# Patient Record
Sex: Female | Born: 1970 | Race: White | Hispanic: No | Marital: Married | Smoking: Current every day smoker
Health system: Southern US, Community
[De-identification: ages and names within clinical notes are randomized; demographics above are authoritative.]

## PROBLEM LIST (undated history)

## (undated) DIAGNOSIS — E119 Type 2 diabetes mellitus without complications: Secondary | ICD-10-CM

## (undated) DIAGNOSIS — G4730 Sleep apnea, unspecified: Secondary | ICD-10-CM

## (undated) DIAGNOSIS — J449 Chronic obstructive pulmonary disease, unspecified: Secondary | ICD-10-CM

## (undated) DIAGNOSIS — K219 Gastro-esophageal reflux disease without esophagitis: Secondary | ICD-10-CM

## (undated) DIAGNOSIS — G43909 Migraine, unspecified, not intractable, without status migrainosus: Secondary | ICD-10-CM

## (undated) DIAGNOSIS — M5030 Other cervical disc degeneration, unspecified cervical region: Secondary | ICD-10-CM

## (undated) DIAGNOSIS — J45909 Unspecified asthma, uncomplicated: Secondary | ICD-10-CM

## (undated) HISTORY — DX: Gastro-esophageal reflux disease without esophagitis: K21.9

## (undated) HISTORY — DX: Chronic obstructive pulmonary disease, unspecified: J44.9

## (undated) HISTORY — PX: SHOULDER ARTHROSCOPY: SHX128

## (undated) HISTORY — DX: Migraine, unspecified, not intractable, without status migrainosus: G43.909

## (undated) HISTORY — DX: Other cervical disc degeneration, unspecified cervical region: M50.30

## (undated) HISTORY — DX: Type 2 diabetes mellitus without complications: E11.9

## (undated) HISTORY — DX: Unspecified asthma, uncomplicated: J45.909

## (undated) HISTORY — PX: ANTERIOR FUSION CERVICAL SPINE: SUR626

## (undated) HISTORY — DX: Sleep apnea, unspecified: G47.30

## (undated) HISTORY — PX: CLAVICLE EXCISION: SUR503

---

## 2016-04-02 ENCOUNTER — Ambulatory Visit (HOSPITAL_COMMUNITY)
Admission: RE | Admit: 2016-04-02 | Discharge: 2016-04-02 | Disposition: A | Discharge status: Home / Self Care | Payer: Medicaid Other | Source: Ambulatory Visit | Attending: Obstetrics and Gynecology | Admitting: Obstetrics and Gynecology

## 2016-04-02 ENCOUNTER — Encounter (HOSPITAL_COMMUNITY): Payer: Self-pay | Admitting: *Deleted

## 2016-04-02 DIAGNOSIS — O09521 Supervision of elderly multigravida, first trimester: Secondary | ICD-10-CM | POA: Diagnosis not present

## 2016-04-02 DIAGNOSIS — Z3A13 13 weeks gestation of pregnancy: Secondary | ICD-10-CM | POA: Insufficient documentation

## 2016-04-02 DIAGNOSIS — O352XX0 Maternal care for (suspected) hereditary disease in fetus, not applicable or unspecified: Secondary | ICD-10-CM

## 2016-04-02 DIAGNOSIS — Z315 Encounter for genetic counseling: Secondary | ICD-10-CM | POA: Insufficient documentation

## 2016-04-04 ENCOUNTER — Other Ambulatory Visit (HOSPITAL_COMMUNITY): Payer: Self-pay

## 2016-04-04 DIAGNOSIS — Z3A13 13 weeks gestation of pregnancy: Secondary | ICD-10-CM | POA: Insufficient documentation

## 2016-04-04 DIAGNOSIS — O09529 Supervision of elderly multigravida, unspecified trimester: Secondary | ICD-10-CM | POA: Insufficient documentation

## 2016-04-04 DIAGNOSIS — O352XX0 Maternal care for (suspected) hereditary disease in fetus, not applicable or unspecified: Secondary | ICD-10-CM | POA: Insufficient documentation

## 2016-04-04 NOTE — Progress Notes (Signed)
Appointment Date: 04/02/2016 DOB: 12/26/1970 Referring Provider: Lennox Solders, DO Attending: Dr. Berna Spare  [name] and her partner, [name], were seen for genetic counseling because of a maternal age of 45 y.o. as well as family history concerns.  [name] oldest child, [name], was also present during our visit.     In summary:  Discussed AMA and associated risk for fetal aneuploidy  Discussed options for screening - NIPS drawn today  First screen  Quad screen  NIPS  Ultrasound  Discussed diagnostic testing options - declined  CVS  Amniocentesis  Reviewed family history concerns  Daughter has microdeletion 10q21.3 (maternally inherited)  Daughter has MECP2 and SLC2A1 variants (paternally inherited)  Son with Autism  Discussed carrier screening options - drawn today  CF  SMA  Hemoglobinopathies  They were counseled regarding maternal age and the association with risk for chromosome conditions due to nondisjunction with aging of the ova.   We reviewed chromosomes, nondisjunction, and the associated 1 in 10 risk for fetal aneuploidy related to a maternal age of 45 y.o. at [redacted]w[redacted]d gestation.  She was counseled that the risk for aneuploidy decreases as gestational age increases, accounting for those pregnancies which spontaneously abort.  We specifically discussed Down syndrome (trisomy 88), trisomies 33 and 64, and sex chromosome aneuploidies (47,XXX and 47,XXY) including the common features and prognoses of each.   We reviewed available screening options including First Screen, Quad screen, noninvasive prenatal screening (NIPS)/cell free DNA (cfDNA) screening, and detailed ultrasound.  They were counseled that screening tests are used to modify a patient's a priori risk for aneuploidy, typically based on age. This estimate provides a pregnancy specific risk assessment. We reviewed the benefits and limitations of each option. Specifically, we discussed  the conditions for which each test screens, the detection rates, and false positive rates of each. They were also counseled regarding diagnostic testing via CVS and amniocentesis. We reviewed the approximate 1 in 300-500 risk for complications from amniocentesis, including spontaneous pregnancy loss. We discussed the possible results that the tests might provide including: positive, negative, unanticipated, and no result. Finally, they were counseled regarding the cost of each option and potential out of pocket expenses.  After consideration of all the options, they elected to proceed with NIPS.  Those results will be available in 8-10 days.    [name] was provided with written information regarding cystic fibrosis (CF), spinal muscular atrophy (SMA) and hemoglobinopathies including the carrier frequency, availability of carrier screening and prenatal diagnosis if indicated.  In addition, we discussed that CF and hemoglobinopathies are routinely screened for as part of the [place] newborn screening panel.  After further discussion, she elected to pursue screening for CF, SMA and hemoglobinopathies.  Both family histories were reviewed.  [name] reported that her daughter, [name], was diagnosed with seizures at 48 months of age, she has intellectual disabilities, anxiety, speech delays and musculoskeletal pain.  [name] has been evaluated by medical genetics and undergone several different genetic testing panels.  [name] had an epilepsy panel performed which identified variants in 3 different genes known to be associated with autism and developmental delay.  One of those genes, CNTNAP2 is thought to function in a recessive way, therefore, the finding of only a single alteration is likely not clinically relevant. The other two genes, MECP2 and SLC2A1 are thought to function in an autosomal dominant way and both [name] and her ex-husband were tested for these variants.  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   Thus, while causality has not been established for either of these variants, given that the father of the baby is not [name], we would not be considered about these variants being found in her current pregnancy.  [name] also underwent microarray analysis through [facility].  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  The interpretation of [name]'s report indicated that because [name] was asymptomatic, it was unlikely that this deletion was related to [name]'s diagnoses.  However, that possibility could not be entirely ruled out.  In addition, there was the possibility that this deletion, in combination with other genetic and possibly non-genetic factors, could be contributing to [name]'s diagnoses. If those other "genetic factors" were paternal in origin, then the chance of recurrence for [name]'s current pregnancy would be small, as there is a different father of the baby.  [name] understands that because a specific cause for [name]'s diagnoses has not been established, we cannot ascribe a specific recurrence chance for [name]'s current pregnancy.  [name] reported that her second child, a son, has a diagnosis of autism.  She reported that he is high functioning and in the typical classroom.  In addition, he had unilateral preaxial polydactyly.  We discussed that autism is part of the spectrum of conditions referred to as Autistic spectrum disorders (ASD). We discussed that ASDs are among the most common neurodevelopmental disorders, with approximately 1 in 68 children meeting criteria for ASD, according to the Centers for Disease Control. Approximately 80% of individuals diagnosed are female. There is strong evidence that genetic factors play a critical role in development of ASD. There have been recent advances in identifying specific genetic causes of ASD, however, there are still many individuals for whom the etiology of the  ASD is not known. The majority of individuals with ASD (70-80%) have essential autism. Some individuals with ASDs are found to have causative differences on karyotype analysis, chromosomal microarray analysis, or single genes. These are more likely to be identified in individuals with complex autism spectrum disorders.  It is unclear if her son has had a workup similar to her daughter's.   Once a family has a child with a diagnosis of ASD, there is a 13.5% chance to have another child with ASD. If the pregnancy is female the chance is approximately 9%, and approximately 26% if the pregnancy is female. When there is more than one affected sibling, the recurrence chance is 32%. Therefore, based on what we know about this family and that there is a different father for her current pregnancy, an accurate recurrence chance for this pregnancy cannot be established. They understand that at this time there is not genetic testing available for ASD for most families. In the case of an identified genetic cause, recurrence risk estimate may change, and in some cases could be up to 50%. The patient is aware that for many individuals with autism spectrum disorders an underlying genetic cause is not identified. In the absence of an identified genetic etiology, prenatal screening or testing would not be available in the current pregnancy for the autism spectrum disorders in the family.   [name] has two maternal half siblings with intellectual disabilities, but does not have more information about those people.  The remainder of the family histories were reviewed and found to be noncontributory for birth defects, intellectual disability, and known genetic conditions. Without further information regarding the  provided family history, an accurate genetic risk cannot be calculated. Further genetic counseling is warranted if more information is obtained.  Ms. Wendy RobinsonsHogan denied exposure to environmental toxins or chemical agents. She  denied the use of alcohol, tobacco or street drugs. She denied significant viral illnesses during the course of her pregnancy. Her medical and surgical histories were noncontributory.   I counseled this couple regarding the above risks and available options.  The approximate face-to-face time with the genetic counselor was 95 minutes. Some of the counseling was provided by Vassie MomentKelly Kemak, [employer] genetic counseling student, under my direct supervision.   Mady Gemmaaragh Akaisha Truman, MS,  Certified Genetic Counselor

## 2016-04-09 ENCOUNTER — Telehealth (HOSPITAL_COMMUNITY): Payer: Self-pay | Admitting: Genetics

## 2016-04-09 NOTE — Telephone Encounter (Signed)
Called [name] to discuss her prenatal cell free DNA test results.  Mrs. [name] had Panorama testing through [facility].  Testing was offered because of AMA.   The patient was identified by name and DOB.  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  Mrs. [name] asked appropriate questions regarding the condition.  Despite discussing it prior to screening, and discussing the severity and life limiting nature of the condition on the phone, Mrs. [name] did not seem to grasp the concern related to this finding.  She asked that we contact her boyfriend, [name], to discuss these results with him. Despite multiple attempts, we were unable to reach him at this time.  Mrs. [name] was offered an appointment to meet in person to review these results and she requested to meet on Thursday, September 28th.  This was arranged for her at 1pm. She understands that the genetic counselor will be different from the one she initially met at her previous genetic counseling visit. She understands that the false positive rate is <0.1% for all conditions. Testing was also consistent with female fetal sex.  The patient did wish to know fetal sex.  She understands that this testing does not identify all genetic conditions.  All questions were answered to her satisfaction, she was encouraged to call with additional questions or concerns.  Cam Hai, MS Certified Genetic Counselor

## 2016-04-10 ENCOUNTER — Other Ambulatory Visit (HOSPITAL_COMMUNITY): Payer: Self-pay | Admitting: Maternal and Fetal Medicine

## 2016-04-10 ENCOUNTER — Inpatient Hospital Stay: Admission: RE | Admit: 2016-04-10 | Payer: Medicaid Other | Source: Ambulatory Visit

## 2016-04-10 ENCOUNTER — Ambulatory Visit (HOSPITAL_COMMUNITY)
Admission: RE | Admit: 2016-04-10 | Discharge: 2016-04-10 | Disposition: A | Discharge status: Home / Self Care | Payer: Medicaid Other | Source: Ambulatory Visit | Attending: Obstetrics and Gynecology | Admitting: Obstetrics and Gynecology

## 2016-04-10 ENCOUNTER — Ambulatory Visit (HOSPITAL_COMMUNITY)
Admission: RE | Admit: 2016-04-10 | Discharge: 2016-04-10 | Disposition: A | Discharge status: Home / Self Care | Payer: Medicaid Other | Source: Ambulatory Visit | Attending: Maternal and Fetal Medicine | Admitting: Maternal and Fetal Medicine

## 2016-04-10 DIAGNOSIS — O283 Abnormal ultrasonic finding on antenatal screening of mother: Secondary | ICD-10-CM | POA: Diagnosis not present

## 2016-04-10 DIAGNOSIS — O3512X0 Maternal care for (suspected) chromosomal abnormality in fetus, trisomy 18, not applicable or unspecified: Secondary | ICD-10-CM

## 2016-04-10 DIAGNOSIS — Z315 Encounter for genetic counseling: Secondary | ICD-10-CM | POA: Diagnosis not present

## 2016-04-10 DIAGNOSIS — O09521 Supervision of elderly multigravida, first trimester: Secondary | ICD-10-CM | POA: Insufficient documentation

## 2016-04-10 DIAGNOSIS — Z3A10 10 weeks gestation of pregnancy: Secondary | ICD-10-CM

## 2016-04-10 DIAGNOSIS — O3510X0 Maternal care for (suspected) chromosomal abnormality in fetus, unspecified, not applicable or unspecified: Secondary | ICD-10-CM

## 2016-04-10 DIAGNOSIS — O351XX0 Maternal care for (suspected) chromosomal abnormality in fetus, not applicable or unspecified: Secondary | ICD-10-CM

## 2016-04-14 ENCOUNTER — Encounter (HOSPITAL_COMMUNITY): Payer: Self-pay

## 2016-04-14 ENCOUNTER — Other Ambulatory Visit (HOSPITAL_COMMUNITY): Payer: Self-pay

## 2016-04-14 DIAGNOSIS — O3510X0 Maternal care for (suspected) chromosomal abnormality in fetus, unspecified, not applicable or unspecified: Secondary | ICD-10-CM | POA: Insufficient documentation

## 2016-04-14 DIAGNOSIS — O351XX0 Maternal care for (suspected) chromosomal abnormality in fetus, not applicable or unspecified: Secondary | ICD-10-CM | POA: Insufficient documentation

## 2016-04-14 NOTE — Progress Notes (Signed)
Genetic Counseling  High-Risk Gestation Note  Appointment Date:  04/10/16 Referred By: Wendy Solders, DO Date of Birth:  15-Mar-1971   Estimated Date of Delivery: 10/31/16 Estimated Gestational Age: [redacted]w[redacted]d Attending: Particia Nearing, MD   [name] and her partner were seen for follow-up genetic counseling because of a screen positive result for Trisomy 18 from NIPS (Panorama).   In summary:  Reviewed positive Trisomy 18 risk from NIPS (Panorama) and associated 93% positive predictive value  Discussed screening options of ultrasound  Diagnostic testing options reviewed of CVS and amniocentesis  Ultrasound performed today visualized fetal demise  We reviewed the results of noninvasive prenatal screening (NIPS)/Panorama, which were screen positive for Trisomy 18. NIPS was performed given advanced maternal age. We reviewed that the cell free DNA test can not distinguish between aneuploidy confined to the placenta, trisomy 18, partial trisomy 18, or mosaic trisomy 22. We reviewed the sensitivity, specificity, and false positive rate for this test. Given the patient's age and gestation, the positive predictive value of this result is estimated to be 93%.   Considering the very high suspicion of trisomy 5, they were counseled in detail regarding this diagnosis. We discussed that trisomy 46 represents the second most common autosomal trisomy after Down syndrome and occurs in 1 in 3600-8500 livebirths. The prevalence is estimated to be much higher when pregnancy terminations, stillbirths, and miscarriages are included. [name] was counseled that trisomy 31 results from meiotic nondisjunction involving the 18th pair of chromosomes in 94% of cases. Partial trisomy 18 occurs from an unbalanced translocation and represents <2% of cases; and mosaic trisomy 18 occurs in approximately 4-5% of cases.   We discussed the options of confirmation of the NIPS results via chorionic villus  sampling (CVS) or amniocentesis, or postnatal chromosome analysis. They expressed possible interest in amniocentesis but declined CVS. We discussed the options of screening via ultrasound including nuchal translucency assessment in the first trimester and detailed ultrasound in the second trimester.   She was then counseled that trisomy 21 is characterized by prenatal onset of growth deficiency, neurodevelopmental delay that typically results in profound intellectual disability in survivors, and significant organ system anomalies. Although any organ system can be involved, we specifically discussed that cardiac defects occur in 90% of children with trisomy 18; anomalies of the brain are present in nearly all (agenesis of the corpus callosum, small cerebellum, microgyria, and ventriculomegaly) individuals with trisomy 18; myelomeningocele occurs in 5% and approximately 2/3 of children with trisomy 18 have genitourinary anomalies, most commonly a horseshoe kidney. Additionally, omphaloceles are seen in ~30% of fetuses with trisomy 18 and limb deficiency defects (short or absent radii) occur in 5-10%. We also discussed that detailed ultrasound at 18+ weeks may identify other more subtle differences (markers) including choroid plexus cysts, rocker bottom feet, clenched hands, and strawberry shaped cranium. Although these later findings do not cause major medical problems, they are well described findings in fetuses with trisomy 38. [name] was counseled that the above discussed findings provide a generalized overview; however, each child with trisomy 40 is unique and an individualized healthcare plan must be established based on the needs of that child.   They were counseled that the prognosis for trisomy 18 is very guarded. She understands that pregnancies with trisomy 25 have a significantly increased risk for miscarriage and stillbirth. Of those that survive to term, ~90% of infants die during the first  year of life. Although this condition is often labeled as  lethal, [name] was counseled that ~5-8% of children do indeed survive, some well beyond a year of life. We discussed that neonates with trisomy 618 most often pass away from respiratory concerns secondary to congenital heart disease.   Although the amniocentesis results are pending, we discussed the option of continuing the pregnancy versus termination of pregnancy. [name] reported that she is not interested in the option of termination even if the diagnosis of trisomy 1618 is confirmed. She was counseled regarding the 20 week limit for this procedure in the state of [place]. We then discussed the pregnancy management recommendations and options. We discussed the option of a detailed anatomy ultrasound at 18+ weeks gestation to determine specific organ system involvement. Although this does not clearly predict prognosis, she understands that identifying which organs are involved can help her healthcare team provide appropriate anticipatory guidance.   The couple elected to proceed with nuchal translucency ultrasound today. Ultrasound was performed today following the couple's genetic counseling visit. Fetal demise was noted on today's ultrasound. Complete report under separate cover. This information was communicated to [name] by Dr. Sherrie Georgeecker at the time of today's visit. The patient planned to follow-up with her primary OB.   I counseled this couple for approximately 35 minutes regarding the above risks and available options.   Quinn PlowmanKaren Porche Steinberger, MS,  Certified Genetic Counselor 04/14/2016

## 2016-04-21 ENCOUNTER — Other Ambulatory Visit (HOSPITAL_COMMUNITY): Payer: Self-pay

## 2017-02-12 ENCOUNTER — Ambulatory Visit: Payer: Medicare HMO | Admitting: Allergy and Immunology

## 2017-07-19 IMAGING — US US MFM OB COMPLETE +14 WKS
1 series · 14 of 28 positions shown · non-contrast
Comparison: none

[Series 1: us mfm ob complete +14 wks · 14 of 63 slices shown]
[im 3/63]
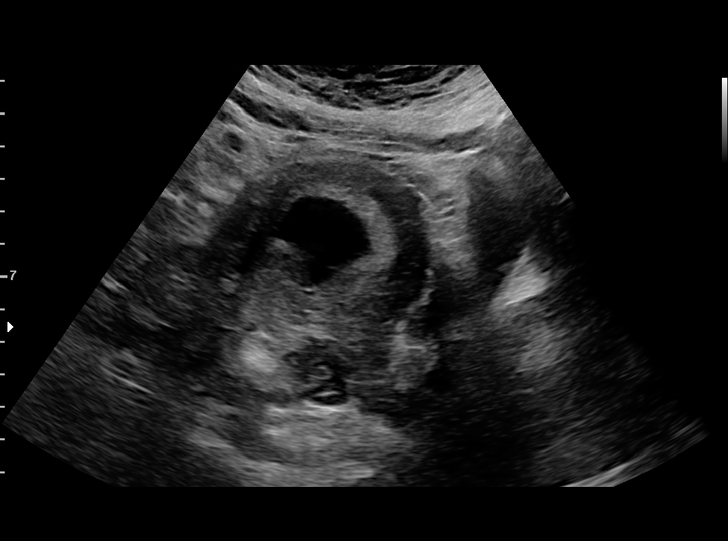
[im 7/63]
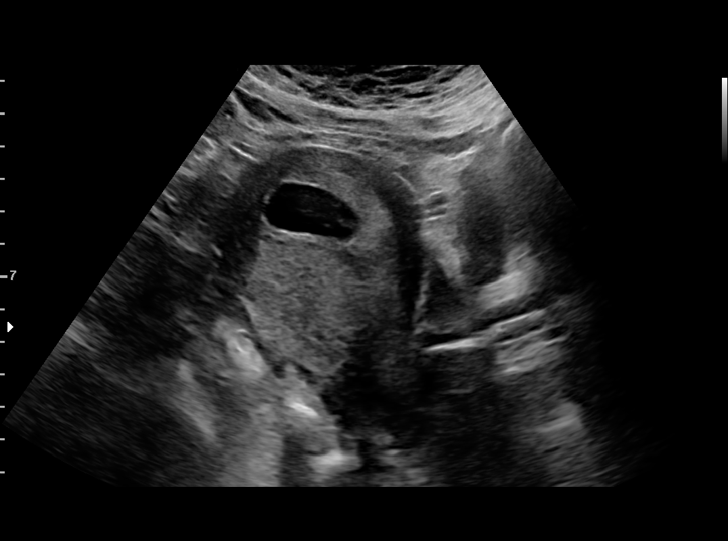
[im 12/63]
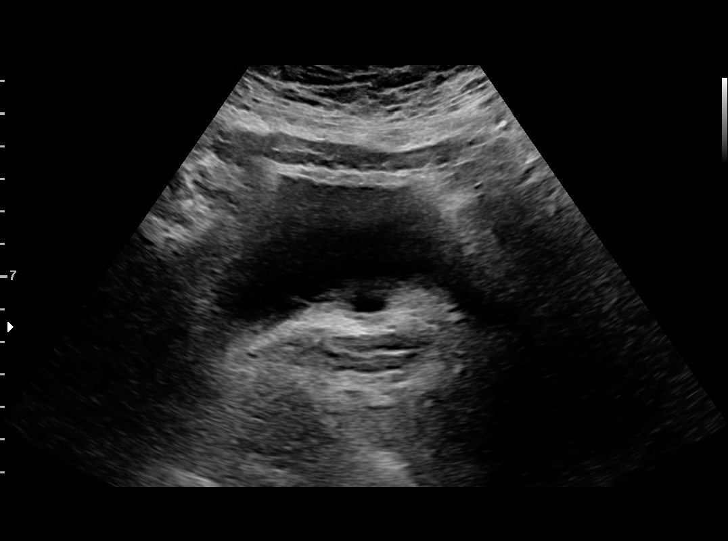
[im 17/63]
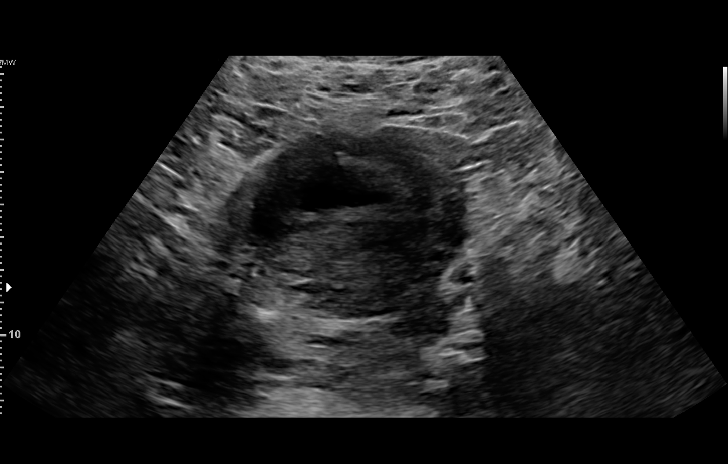
[im 21/63]
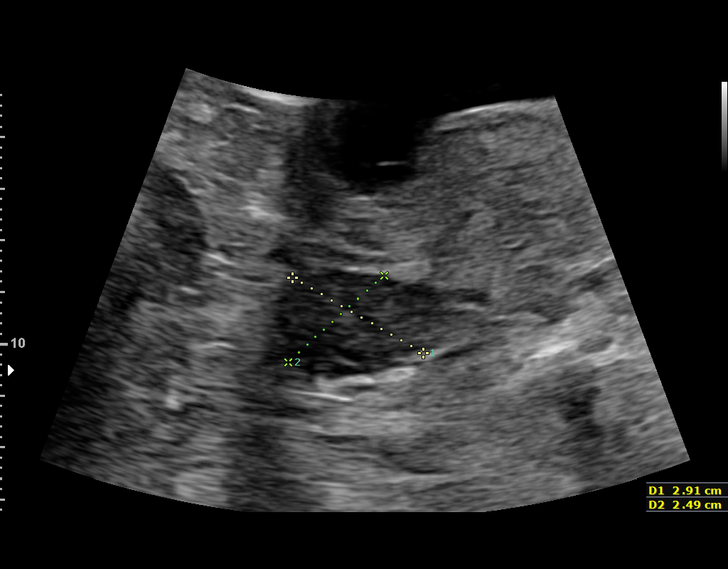
[im 26/63]
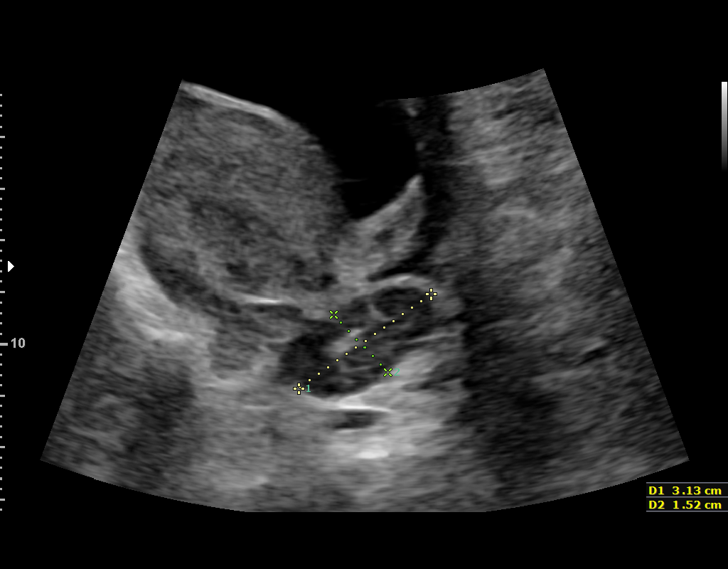
[im 30/63]
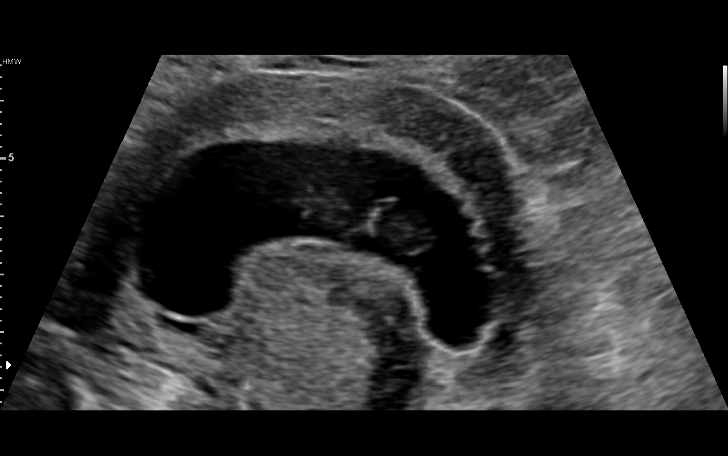
[im 35/63]
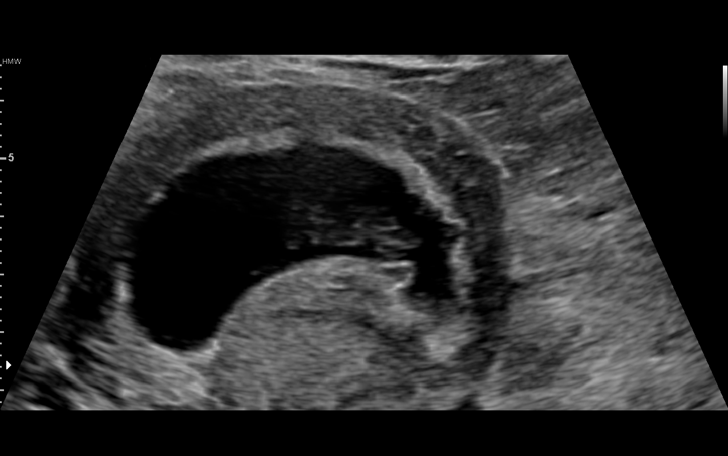
[im 40/63]
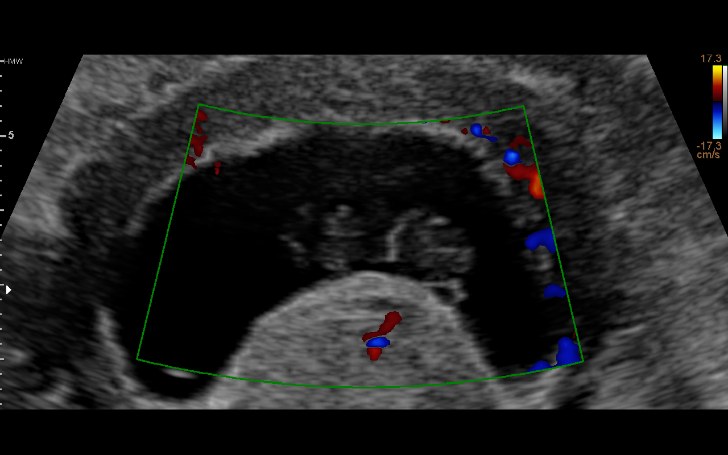
[im 44/63]
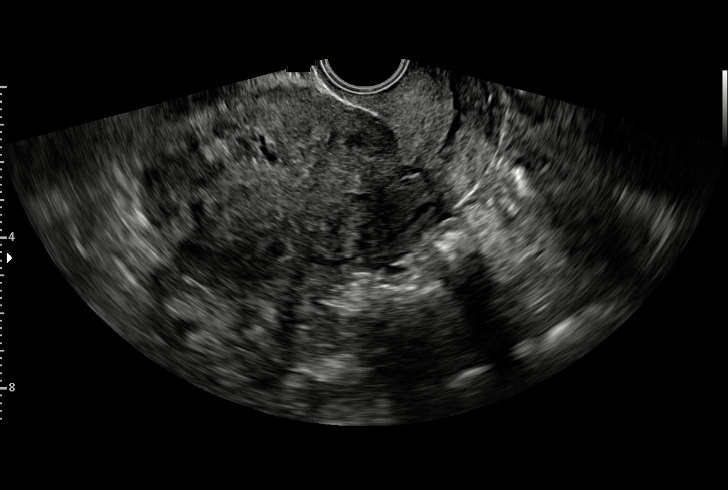
[im 49/63]
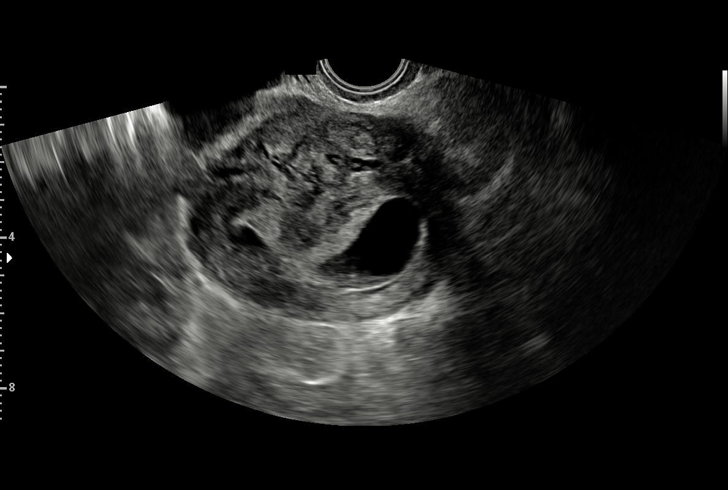
[im 53/63]
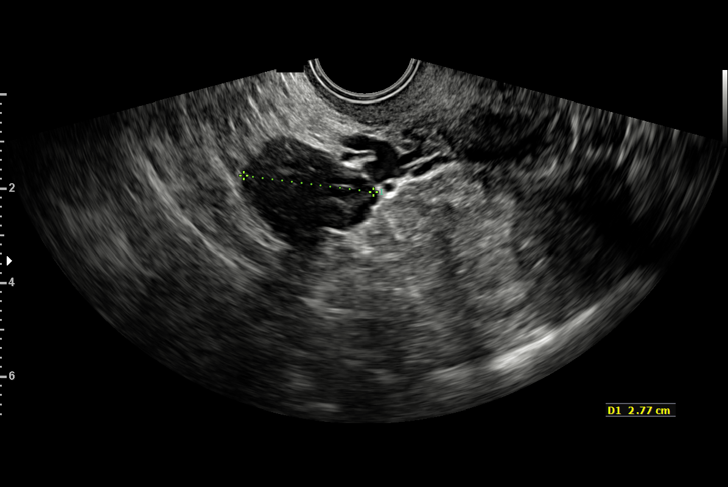
[im 58/63]
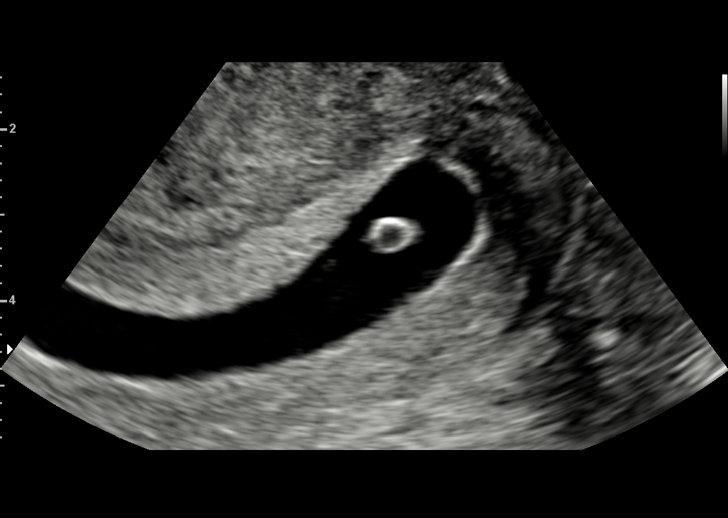
[im 63/63]
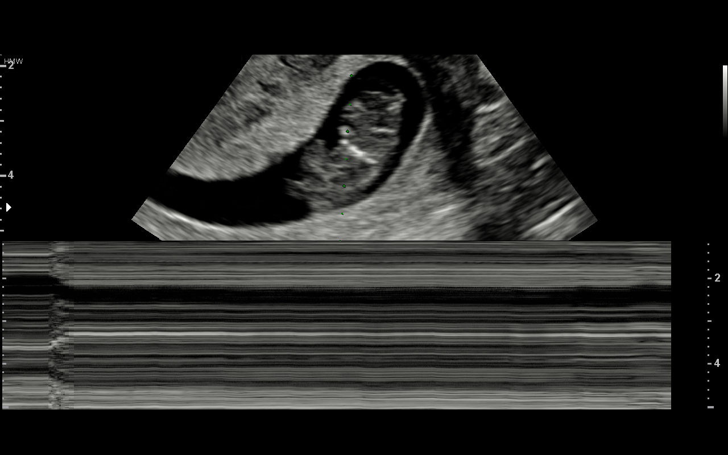

[14 of 28 positions shown; findings below may reference images not displayed]

[REDACTED],
[address]
[HOSPITAL], [place] DO

WEEKS

1  INGUNN HARPA RONLOR            [id number]      [id number]     [id number]
2  INGUNN HARPA RONLOR            [id number]      [id number]     [id number]
Indications

10 weeks gestation of pregnancy
Abnormal biochemical screen: high risk
NIPS for Trisomy 18
Advanced maternal age multigravida 36+,
first trimester
OB History

Gravidity:    4         Term:   3
Living:       3
Fetal Evaluation

Num Of Fetuses:     1
Preg. Location:     Intrauterine
Gest. Sac:          Intrauterine
Yolk Sac:           Visualized
Fetal Pole:         Visualized
Cardiac Activity:   Absent
Biometry

CRL:        26  mm     G. Age:  9w 2d                    EDD:   11/11/16
Gestational Age

Clinical EDD:  10w 6d                                        EDD:    10/31/16
Best:          10w 6d     Det. By:  Clinical EDD             EDD:    10/31/16
Cervix Uterus Adnexa

Cervix
Normal appearance by transabdominal scan.

Uterus
No abnormality visualized.

Left Ovary
Within normal limits.

Right Ovary
Within normal limits.

Cul De Sac:   No free fluid seen.

Adnexa:       No abnormality visualized.
Impression

Fetal demise at 10+6 weeks
High risk NIPS for T18
Embryo measured 9+2 weeks with ? omphalocele

The US findings were shared with Ms. Dal and her
husband.
Recommendations

Follow-up ultrasounds as clinically indicated.

## 2019-04-26 DIAGNOSIS — D72829 Elevated white blood cell count, unspecified: Secondary | ICD-10-CM

## 2023-10-26 ENCOUNTER — Other Ambulatory Visit: Payer: Self-pay | Admitting: Hematology and Oncology

## 2023-10-26 DIAGNOSIS — D72829 Elevated white blood cell count, unspecified: Secondary | ICD-10-CM
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
## 2023-10-27 ENCOUNTER — Inpatient Hospital Stay

## 2023-10-27 ENCOUNTER — Telehealth: Payer: Self-pay | Admitting: Hematology and Oncology

## 2023-10-27 ENCOUNTER — Other Ambulatory Visit: Payer: Self-pay

## 2023-10-27 ENCOUNTER — Inpatient Hospital Stay: Attending: Hematology and Oncology | Admitting: Hematology and Oncology

## 2023-10-27 ENCOUNTER — Encounter: Payer: Self-pay | Admitting: Hematology and Oncology

## 2023-10-27 VITALS — BP 107/78 | HR 93 | Temp 98.5°F | Resp 18 | Ht 63.9 in | Wt 157.2 lb

## 2023-10-27 DIAGNOSIS — Z803 Family history of malignant neoplasm of breast: Secondary | ICD-10-CM | POA: Diagnosis not present

## 2023-10-27 DIAGNOSIS — F17210 Nicotine dependence, cigarettes, uncomplicated: Secondary | ICD-10-CM | POA: Diagnosis not present

## 2023-10-27 DIAGNOSIS — D751 Secondary polycythemia: Secondary | ICD-10-CM | POA: Diagnosis present

## 2023-10-27 DIAGNOSIS — D72829 Elevated white blood cell count, unspecified: Secondary | ICD-10-CM

## 2023-10-27 DIAGNOSIS — Z800 Family history of malignant neoplasm of digestive organs: Secondary | ICD-10-CM | POA: Insufficient documentation

## 2023-10-27 LAB — CBC WITH DIFFERENTIAL (CANCER CENTER ONLY)
Abs Immature Granulocytes: 0.07 10*3/uL (ref 0.00–0.07)
Basophils Absolute: 0.1 10*3/uL (ref 0.0–0.1)
Basophils Relative: 1 %
Eosinophils Absolute: 0.2 10*3/uL (ref 0.0–0.5)
Eosinophils Relative: 2 %
HCT: 45.7 % (ref 36.0–46.0)
Hemoglobin: 15.4 g/dL — ABNORMAL HIGH (ref 12.0–15.0)
Immature Granulocytes: 1 %
Lymphocytes Relative: 35 %
Lymphs Abs: 5.1 10*3/uL — ABNORMAL HIGH (ref 0.7–4.0)
MCH: 29.4 pg (ref 26.0–34.0)
MCHC: 33.7 g/dL (ref 30.0–36.0)
MCV: 87.2 fL (ref 80.0–100.0)
Monocytes Absolute: 0.8 10*3/uL (ref 0.1–1.0)
Monocytes Relative: 5 %
Neutro Abs: 8.5 10*3/uL — ABNORMAL HIGH (ref 1.7–7.7)
Neutrophils Relative %: 56 %
Platelet Count: 355 10*3/uL (ref 150–400)
RBC: 5.24 MIL/uL — ABNORMAL HIGH (ref 3.87–5.11)
RDW: 12.9 % (ref 11.5–15.5)
WBC Count: 14.9 10*3/uL — ABNORMAL HIGH (ref 4.0–10.5)
nRBC: 0 % (ref 0.0–0.2)
nRBC: 0 /100{WBCs}

## 2023-10-27 LAB — TECHNOLOGIST SMEAR REVIEW

## 2023-10-27 NOTE — Telephone Encounter (Signed)
 Patient has been scheduled for follow-up visit per 10/27/23 LOS.  Pt given an appt calendar with date and time.

## 2023-10-28 LAB — SURGICAL PATHOLOGY

## 2023-10-29 LAB — FLOW CYTOMETRY

## 2023-10-31 LAB — BCR-ABL1 FISH
Cells Analyzed: 200
Cells Counted: 200

## 2023-11-10 ENCOUNTER — Inpatient Hospital Stay: Admitting: Hematology and Oncology

## 2023-11-10 ENCOUNTER — Inpatient Hospital Stay

## 2023-11-10 ENCOUNTER — Encounter: Payer: Self-pay | Admitting: Hematology and Oncology

## 2023-11-10 VITALS — BP 106/77 | HR 94 | Temp 98.7°F | Resp 20 | Ht 63.9 in | Wt 157.9 lb

## 2023-11-10 DIAGNOSIS — D751 Secondary polycythemia: Secondary | ICD-10-CM | POA: Diagnosis not present

## 2023-11-10 DIAGNOSIS — D72829 Elevated white blood cell count, unspecified: Secondary | ICD-10-CM

## 2023-11-10 NOTE — Progress Notes (Cosign Needed)
 [facility]  [address] [place],  [place]  [address] [phone]  Clinic Day:  11/10/2023  Referring physician: Spero Dye, PA-C   HISTORY OF PRESENT ILLNESS:  The patient is a 53 y.o. female with leukocytosis felt to be most likely due to cigarette smoking and chronic lung disease. She had a positive ANA 1:320 in 2024 and was seen by rheumatology. Further evaluation was otherwise negative.  At her consultation visit, she had cough for several weeks.  She denied unintentional weight loss or hemoptysis.  She has never had low-dose CT chest for lung cancer screening.  Due to her family history of malignancy, I recommend referral to genetic counseling for consideration of testing for hereditary cancer syndromes.   She is here today to review the results of the evaluation of her leukocytosis.Testing for CML and CLL was negative.  She states her cough is improving.  She denies unintentional weight loss or hemoptysis.  She continues to report generalized joint pain, as well as tingling and numbness of the hands and feet.  She has had an MRI of the brain, but we do not have the results.   PHYSICAL EXAM:  Blood pressure 106/77, pulse 94, temperature 98.7 F (37.1 C), temperature source Oral, resp. rate 20, height 5' 3.9" (1.623 m), weight 157 lb 14.4 oz (71.6 kg), SpO2 97%, unknown if currently breastfeeding. Wt Readings from Last 3 Encounters:  11/10/23 157 lb 14.4 oz (71.6 kg)  10/27/23 157 lb 3.2 oz (71.3 kg)   Body mass index is 27.19 kg/m.  Performance status (ECOG): 1 - Symptomatic but completely ambulatory  Physical Exam Vitals and nursing note reviewed.  Constitutional:      General: She is not in acute distress.    Appearance: Normal appearance.  HENT:     Head: Normocephalic and atraumatic.     Mouth/Throat:     Mouth: Mucous membranes are moist.     Pharynx: Oropharynx is clear. No oropharyngeal exudate or posterior oropharyngeal erythema.   Eyes:     General: No scleral icterus.    Extraocular Movements: Extraocular movements intact.     Conjunctiva/sclera: Conjunctivae normal.     Pupils: Pupils are equal, round, and reactive to light.  Cardiovascular:     Rate and Rhythm: Normal rate and regular rhythm.     Heart sounds: Normal heart sounds. No murmur heard.    No friction rub. No gallop.  Pulmonary:     Effort: Pulmonary effort is normal.     Breath sounds: Normal breath sounds. No wheezing, rhonchi or rales.  Abdominal:     General: There is no distension.     Palpations: Abdomen is soft. There is no hepatomegaly, splenomegaly or mass.     Tenderness: There is no abdominal tenderness.  Musculoskeletal:        General: Normal range of motion.     Cervical back: Normal range of motion and neck supple. No tenderness.     Right lower leg: No edema.     Left lower leg: No edema.  Lymphadenopathy:     Cervical: No cervical adenopathy.     Upper Body:     Right upper body: No supraclavicular or axillary adenopathy.     Left upper body: No supraclavicular or axillary adenopathy.     Lower Body: No right inguinal adenopathy. No left inguinal adenopathy.  Skin:    General: Skin is warm and dry.     Coloration: Skin is not  jaundiced.     Findings: No rash.  Neurological:     Mental Status: She is alert and oriented to person, place, and time.     Cranial Nerves: No cranial nerve deficit.  Psychiatric:        Mood and Affect: Mood normal.        Behavior: Behavior normal.        Thought Content: Thought content normal.     LABS:      Latest Ref Rng & Units 10/27/2023    2:20 PM  CBC  WBC 4.0 - 10.5 K/uL 14.9   Hemoglobin 12.0 - 15.0 g/dL 16.1   Hematocrit 09.6 - 46.0 % 45.7   Platelets 150 - 400 K/uL 355      Latest Reference Range & Units 10/27/23 14:20  Neutrophils % 56  Lymphocytes % 35  Monocytes Relative % 5  Eosinophil % 2  Basophil % 1  Immature Granulocytes % 1  NEUT# 1.7 - 7.7 K/uL 8.5 (H)   Lymphs Abs 0.7 - 4.0 K/uL 5.1 (H)  Monocyte # 0.1 - 1.0 K/uL 0.8  Eosinophils Absolute 0.0 - 0.5 K/uL 0.2  Basophils Absolute 0.0 - 0.1 K/uL 0.1  Abs Immature Granulocytes 0.00 - 0.07 K/uL 0.07  RBC Morphology  MORPHOLOGY UNREMARKABLE  WBC Morphology  MORPHOLOGY UNREMARKABLE  Plt Morphology  GIANT PLATELETS SEEN  (H): Data is abnormally high     STUDIES:  No results found.    ASSESSMENT & PLAN:   Assessment/Plan:  53 y.o. female with leukocytosis felt to be most likely due to cigarette smoking.  She understands the importance of complete abstinence from tobacco.  I will plan to see her back in 6 months for repeat clinical assessment.  The patient understands all the plans discussed today and is in agreement with them.  She knows to contact our office if she develops concerns prior to her next appointment.     Alfonso Ike, PA-C   Physician Assistant [facility] [place] [phone]

## 2023-11-12 ENCOUNTER — Encounter: Payer: Self-pay | Admitting: Hematology and Oncology

## 2023-11-16 LAB — CALR +MPL + E12-E15  (REFLEX)

## 2023-11-16 LAB — JAK2 V617F RFX CALR/MPL/E12-15

## 2023-11-17 ENCOUNTER — Telehealth: Payer: Self-pay

## 2023-11-17 NOTE — Telephone Encounter (Signed)
-----   Message from Alfonso Ike sent at 11/17/2023  7:54 AM EDT ----- Please let her know the additional leukemia testing is negative. Thanks

## 2023-11-17 NOTE — Telephone Encounter (Signed)
 Patient notified and voiced understanding.

## 2023-12-03 ENCOUNTER — Inpatient Hospital Stay

## 2023-12-03 ENCOUNTER — Inpatient Hospital Stay (HOSPITAL_BASED_OUTPATIENT_CLINIC_OR_DEPARTMENT_OTHER): Attending: Hematology and Oncology | Admitting: Genetic Counselor

## 2023-12-03 ENCOUNTER — Encounter: Payer: Self-pay | Admitting: Genetic Counselor

## 2023-12-03 DIAGNOSIS — Z1379 Encounter for other screening for genetic and chromosomal anomalies: Secondary | ICD-10-CM

## 2023-12-03 DIAGNOSIS — Z808 Family history of malignant neoplasm of other organs or systems: Secondary | ICD-10-CM

## 2023-12-03 DIAGNOSIS — Z803 Family history of malignant neoplasm of breast: Secondary | ICD-10-CM | POA: Diagnosis not present

## 2023-12-03 DIAGNOSIS — Z800 Family history of malignant neoplasm of digestive organs: Secondary | ICD-10-CM

## 2023-12-03 LAB — GENETIC SCREENING ORDER

## 2023-12-03 NOTE — Progress Notes (Signed)
 REFERRING PROVIDER: Alfonso Ike, PA-C [address] [place],  [place] [address]  PRIMARY PROVIDER:  Nodal, Ulysees Gander, PA-C  PRIMARY REASON FOR VISIT:  Encounter Diagnoses  Name Primary?   Family history of breast cancer Yes   Family history of melanoma    Family history of cancer of extrahepatic bile ducts     HISTORY OF PRESENT ILLNESS:   Ms. Babic, a 53 y.o. female, was seen for a  cancer genetics consultation at the request of Vera Gip, PA-C due to a family history of breast and other cancers.  Ms. Delatorre presents to clinic today to discuss the possibility of a hereditary predisposition to cancer, to discuss genetic testing, and to further clarify her future cancer risks, as well as potential cancer risks for family members.   Ms. Grieshop is a 53 y.o. female with no personal history of cancer.    CANCER HISTORY:  Oncology History   No history exists.    SCREENING/RISK FACTORS:  Mammogram within the last year: no Number of breast biopsies: 0. Colonoscopy: yes; most recent in February; 1 polyp per patient. Hysterectomy: no.  Ovaries intact: yes.  Menarche was at age 24.  First live birth at age 60.  Menopausal status: postmenopausal. LMP at 52 OCP use for approximately 15 years.  HRT use: 0 years. Dermatology screening: previously.   Blood transfusion within past 4 weeks: no   Past Medical History:  Diagnosis Date   Asthma    COPD (chronic obstructive pulmonary disease) (HCC)    DDD (degenerative disc disease), cervical    Diabetes (HCC)    GERD (gastroesophageal reflux disease)    Migraines    Sleep apnea     Past Surgical History:  Procedure Laterality Date   ANTERIOR FUSION CERVICAL SPINE     CLAVICLE EXCISION Right    SHOULDER ARTHROSCOPY Right      FAMILY HISTORY:  We obtained a detailed, 4-generation family history.  Significant diagnoses are listed below: Family History  Problem Relation Age of Onset   Breast cancer Mother  45       d. 10   Cancer Father 9       biliary   Breast cancer Sister        dx 59s; mets; d. 76s; mat half sister   Breast cancer Maternal Aunt        dx >50   Melanoma Maternal Uncle        back/neck; dx >50; no systemic therapy   Breast cancer Maternal Grandmother        dx 10s   Cancer Maternal Grandfather        unknown type; "tumor on neck"; dx >50   Thyroid cancer Cousin        mat female cousin     Ms. Corliss is unaware of previous family history of genetic testing for hereditary cancer risks. She reported possible Jewish ancestry in paternal relatives. There is no known consanguinity.  GENETIC COUNSELING ASSESSMENT: Ms. Chalmers is a 53 y.o. female with a family history which is somewhat suggestive of a hereditary cancer syndrome and predisposition to cancer given the number of maternal relatives who have had breast cancer. We, therefore, discussed and recommended the following at today's visit.   DISCUSSION: We discussed that 5 - 10% of cancer is hereditary, with most cases of hereditary breast cancer associated with mutations in BRCA1/2.  There are other genes that can be associated with hereditary breast cancer syndromes.  We discussed that testing is beneficial for several reasons, including knowing about other cancer risks, identifying potential screening and risk-reduction options that may be appropriate, and understanding if other family members could be at an increased risk for cancer and allowing them to undergo genetic testing.  We reviewed the characteristics, features and inheritance patterns of hereditary cancer syndromes. We also discussed genetic testing, including the appropriate family members to test, the process of testing, insurance coverage and turn-around-time for results. We discussed the implications of a negative, positive, and variant of uncertain significant result. We discussed that negative results would be uninformative given that Ms. Ripoll does not  have a personal history of cancer. We recommended Ms. Poulter pursue genetic testing for a panel that contains genes associated with breast, melanoma, thyroid, biliary tract, and other cancers.  The CancerNext-Expanded gene panel offered by [facility] and includes sequencing, rearrangement, and RNA analysis for the following 77 genes: AIP, ALK, APC, ATM, AXIN2, BAP1, BARD1, BMPR1A, BRCA1, BRCA2, BRIP1, CDC73, CDH1, CDK4, CDKN1B, CDKN2A, CEBPA, CHEK2, CTNNA1, DDX41, DICER1, ETV6, FH, FLCN, GATA2, LZTR1, MAX, MBD4, MEN1, MET, MLH1, MSH2, MSH3, MSH6, MUTYH, NF1, NF2, NTHL1, PALB2, PHOX2B, PMS2, POT1, PRKAR1A, PTCH1, PTEN, RAD51C, RAD51D, RB1, RSP20, RET, RUNX1, SDHA, SDHAF2, SDHB, SDHC, SDHD, SMAD4, SMARCA4, SMARCB1, SMARCE1, STK11, SUFU, TMEM127, TP53, TSC1, TSC2, VHL, and WT1 (sequencing and deletion/duplication); EGFR, HOXB13, KIT, MITF, PDGFRA, POLD1, and POLE (sequencing only); EPCAM and GREM1 (deletion/duplication only).   Based on Ms. Gaumond's family history of breast cancer in four maternal close relatives, she meets NCCN medical criteria for genetic testing. She is the most informative relative available for genetic testing.  We discussed that if her out of pocket cost for testing is over $100, the laboratory should contact them to discuss self-pay options and/or patient pay assistance programs.   We discussed the Genetic Information Non-Discrimination Act (GINA) of 2008, which helps protect individuals against genetic discrimination based on their genetic test results.  It impacts both health insurance and employment.  With health insurance, it protects against genetic test results being used for increased premiums or policy termination. For employment, it protects against hiring, firing and promoting decisions based on genetic test results.  GINA does not apply to those in the [employer], those who work for companies with less than 15 employees, and new life insurance or long-term disability insurance  policies.  Health status due to a cancer diagnosis is not protected under GINA.  PLAN: After considering the risks, benefits, and limitations, Ms. Carolynne Citron provided informed consent to pursue genetic testing and the blood sample was sent to [facility] for analysis of the CancerNext-Expanded +RNAinsight Panel. Results should be available within approximately 3 weeks' time, at which point they will be disclosed by telephone to Ms. Gallego, as will any additional recommendations warranted by these results. Ms. Dupriest will receive a summary of her genetic counseling visit and a copy of her results once available. This information will also be available in Epic.   Based on Ms. Junker's family history, we recommended first degree relatives of those who have had breast cancer have genetic counseling and testing. Ms. Jorstad will let us  know if we can be of any assistance in coordinating genetic counseling and/or testing for appropriate family members.   Ms. Gutkowski questions were answered to her satisfaction today. Our contact information was provided should additional questions or concerns arise. Thank you for the referral and allowing us  to share in the care of your patient.   Urbano Milhouse M.  Ora Billing, MS, [facility] Genetic Counselor [email or web address] (P) [phone]    40 minutes were spent on the date of the encounter in service to the patient including preparation, face-to-face consultation, documentation and care coordination.  The patient was accompanied by her husband.  Drs. Iruku, Gudena and/or Maryalice Smaller were available to discuss this case as needed.   _______________________________________________________________________ For Office Staff:  Number of people involved in session: 2 Was an Intern/ student involved with case: no

## 2023-12-21 ENCOUNTER — Encounter: Payer: Self-pay | Admitting: Genetic Counselor

## 2023-12-21 ENCOUNTER — Ambulatory Visit: Payer: Self-pay | Admitting: Genetic Counselor

## 2023-12-21 DIAGNOSIS — Z9189 Other specified personal risk factors, not elsewhere classified: Secondary | ICD-10-CM

## 2023-12-21 DIAGNOSIS — Z803 Family history of malignant neoplasm of breast: Secondary | ICD-10-CM

## 2023-12-21 DIAGNOSIS — Z1379 Encounter for other screening for genetic and chromosomal anomalies: Secondary | ICD-10-CM | POA: Insufficient documentation

## 2023-12-21 DIAGNOSIS — Z800 Family history of malignant neoplasm of digestive organs: Secondary | ICD-10-CM

## 2023-12-21 DIAGNOSIS — Z808 Family history of malignant neoplasm of other organs or systems: Secondary | ICD-10-CM

## 2023-12-21 HISTORY — DX: Other specified personal risk factors, not elsewhere classified: Z91.89

## 2023-12-21 NOTE — Progress Notes (Signed)
 HPI:   Ms. Sloma was previously seen in the [facility] due to a family history of breast and other cancers and concerns regarding a hereditary predisposition to cancer.    Ms. Holderman recent genetic test results were disclosed to her by telephone. These results and recommendations are discussed in more detail below.  CANCER HISTORY:  Oncology History   No history exists.    FAMILY HISTORY:  We obtained a detailed, 4-generation family history.  Significant diagnoses are listed below: Family History  Problem Relation Age of Onset   Breast cancer Mother 77        d. 72   Cancer Father 66        biliary   Breast cancer Sister          dx 69s; mets; d. 46s; mat half sister   Breast cancer Maternal Aunt          dx >50   Melanoma Maternal Uncle          back/neck; dx >50; no systemic therapy   Breast cancer Maternal Grandmother          dx 61s   Cancer Maternal Grandfather          unknown type; "tumor on neck"; dx >50   Thyroid cancer Cousin          mat female cousin       Ms. Staib is unaware of previous family history of genetic testing for hereditary cancer risks. She reported possible Jewish ancestry in paternal relatives. There is no known consanguinity.  GENETIC TEST RESULTS:  The [facility] CancerNext-Expanded +RNAinsight Panel found no pathogenic mutations.   The CancerNext-Expanded gene panel offered by [facility] and includes sequencing, rearrangement, and RNA analysis for the following 77 genes: AIP, ALK, APC, ATM, AXIN2, BAP1, BARD1, BMPR1A, BRCA1, BRCA2, BRIP1, CDC73, CDH1, CDK4, CDKN1B, CDKN2A, CEBPA, CHEK2, CTNNA1, DDX41, DICER1, ETV6, FH, FLCN, GATA2, LZTR1, MAX, MBD4, MEN1, MET, MLH1, MSH2, MSH3, MSH6, MUTYH, NF1, NF2, NTHL1, PALB2, PHOX2B, PMS2, POT1, PRKAR1A, PTCH1, PTEN, RAD51C, RAD51D, RB1, RET, RUNX1, RSP20, SDHA, SDHAF2, SDHB, SDHC, SDHD, SMAD4, SMARCA4, SMARCB1, SMARCE1, STK11, SUFU, TMEM127, TP53, TSC1, TSC2, VHL, and WT1  (sequencing and deletion/duplication); EGFR, HOXB13, KIT, MITF, PDGFRA, POLD1, and POLE (sequencing only); EPCAM and GREM1 (deletion/duplication only).   The test report has been scanned into EPIC and is located under the Molecular Pathology section of the Results Review tab.  A portion of the result report is included below for reference. Genetic testing reported out on 12/17/2023.       Even though a pathogenic variant was not identified, possible explanations for the cancer in the family may include: There may be no hereditary risk for cancer in the family. The cancers in Ms. Godbey's family may be sporadic/familial or due to other genetic and environmental factors.  Most cancer is not hereditary.  There may be a gene mutation in one of these genes that current testing methods cannot detect but that chance is small. There could be another gene that has not yet been discovered, or that we have not yet tested, that is responsible for the cancer diagnoses in the family.  It is also possible there is a hereditary cause for the cancer in the family that Ms. Vohra did not inherit.   Therefore, it is important to remain in touch with cancer genetics in the future so that we can continue to offer Ms. Gambrill the most up to date genetic testing.  ADDITIONAL GENETIC TESTING:   Ms. Sachs genetic testing was fairly extensive.  If there are additional relevant genes identified to increase cancer risk that can be analyzed in the future, we would be happy to discuss and coordinate this testing at that time.     CANCER SCREENING RECOMMENDATIONS:  Ms. Bartnick test result is considered negative (normal).  This means that we have not identified a hereditary cause for her family history of cancer at this time.   An individual's cancer risk and medical management are not determined by genetic test results alone. Overall cancer risk assessment incorporates additional factors, including personal medical  history, family history, and any available genetic information that may result in a personalized plan for cancer prevention and surveillance. Therefore, it is recommended she continue to follow the cancer management and screening guidelines provided by her primary healthcare provider.  Ms. Eichler has been determined to be at high risk for breast cancer.  Her lifetime risk for breast cancer based on Tyrer-Cuzick risk model is 23.3%.  This risk estimate can change over time and may be repeated to reflect new information in her personal or family history in the future.  For females with a greater than 20% lifetime risk of breast cancer, the [employer] (NCCN) recommends the following:  Clinical encounter every 6-12 months to begin when identified as being at increased risk, but not before age 34  Annual mammograms. Tomosynthesis is recommended starting 10 years earlier than the youngest breast cancer diagnosis in the family or at age 48 (whichever comes first), but not before age 47   Annual breast MRI starting 10 years earlier than the youngest breast cancer diagnosis in the family or at age 5 (whichever comes first), but not before age 92   In addition to a yearly mammogram and physical exam by a healthcare provider, she should discuss the usefulness of an annual breast MRI with her primary care provider.  We discussed that she should also discuss a breast screening plan with her referring provider, Vera Gip, PA.   RECOMMENDATIONS FOR FAMILY MEMBERS:   Since she did not inherit a identifiable mutation in a cancer predisposition gene included on this panel, her children could not have inherited a known mutation from her in one of these genes. Individuals in this family might be at some increased risk of developing cancer, over the general population risk, due to the family history of cancer.  Individuals in the family should notify their providers of the family history of  cancer. We recommend women in this family have a yearly mammogram beginning at age 14, or 37 years younger than the earliest onset of cancer, an annual clinical breast exam, and perform monthly breast self-exams.  Risk models that take into account family history and hormonal history may be helpful in determining appropriate breast cancer screening options for family members.  Family members should have colonoscopies by at age 61, or earlier, as recommended by their providers. Female relatives should speak with their providers about prostate cancer screening.  Other members of the family may still carry a pathogenic variant in one of these genes that Ms. Kerwood did not inherit. Based on the family history, we recommend first degree relatives of those in her maternal family who had breast cancer (now deceased) have genetic counseling and testing. Ms. Coggin can let us  know if we can be of any assistance in coordinating genetic counseling and/or testing for these family members.     FOLLOW-UP:  Cancer genetics is a rapidly advancing field and it is possible that new genetic tests will be appropriate for her and/or her family members in the future. We encourage Ms. Dinh to remain in contact with cancer genetics, so we can update her personal and family histories and let her know of advances in cancer genetics that may benefit this family.   Our contact number was provided.  They are welcome to call us  at anytime with additional questions or concerns.   Omere Marti M. Ora Billing, MS, [facility] Genetic Counselor [email or web address] (P) [phone]

## 2024-02-21 ENCOUNTER — Encounter (HOSPITAL_BASED_OUTPATIENT_CLINIC_OR_DEPARTMENT_OTHER): Payer: Self-pay | Admitting: Emergency Medicine

## 2024-02-21 ENCOUNTER — Ambulatory Visit (HOSPITAL_BASED_OUTPATIENT_CLINIC_OR_DEPARTMENT_OTHER): Admission: EM | Admit: 2024-02-21 | Discharge: 2024-02-21 | Disposition: A | Discharge status: Home / Self Care

## 2024-02-21 DIAGNOSIS — R6884 Jaw pain: Secondary | ICD-10-CM

## 2024-02-21 DIAGNOSIS — B3731 Acute candidiasis of vulva and vagina: Secondary | ICD-10-CM | POA: Diagnosis not present

## 2024-02-21 DIAGNOSIS — K029 Dental caries, unspecified: Secondary | ICD-10-CM | POA: Diagnosis not present

## 2024-02-21 MED ORDER — NYSTATIN 100000 UNIT/GM EX CREA: TOPICAL_CREAM | CUTANEOUS | 0 refills | Status: AC

## 2024-02-21 MED ORDER — FLUCONAZOLE 150 MG PO TABS
ORAL_TABLET | ORAL | 0 refills | Status: DC
Start: 2024-02-21 — End: 2024-05-11

## 2024-02-21 NOTE — Discharge Instructions (Signed)
 Vaginal yeast infection: Educated about causes of yeast infection and treatment options.  Advised about the side effects of use of Jardiance and the need for significant hygiene efforts to reduce risk of yeast infections due to glucosuria from [place].  Apply thin amounts of nystatin  cream vaginally up to 4 times daily if needed for itch or irritation.  Fluconazole  150 mg 1 pill now and repeat every 5 to 7 days up to a total of 4 pills over 3 weeks.  Jaw pain secondary to enlarged lymph nodes and dental caries: Patient has only lower front teeth but she has significant dental caries.  She has some lymphadenopathy in her neck and jaw.  She has Cipro from her primary care for possible UTI and I advised her to complete the Cipro as it might help some of this oral infection.  Follow-up with primary care as needed.  Follow-up with dentist about the dental caries.  Follow-up here if needed.

## 2024-02-21 NOTE — ED Triage Notes (Signed)
 Pt thinks she has a yeast infection and UTI. Pt feels bad she started jardiance for her blood sugars on Friday. Her doctor was scared it might cause a UTI and so she was prescribed Cipro in case She only took one dose of the jardiance because she thinks she was having side effects to medication. Pt reports she has a white thick discharge no itching she had a pap smear recently and it showed yeast on it.

## 2024-02-21 NOTE — ED Provider Notes (Signed)
 [name]    CSN: [id number] Arrival date & time: 02/21/24  1156      History   Chief Complaint Chief Complaint  Patient presents with   Urinary Frequency   Vaginitis    HPI [name] is a 53 y.o. female.   53 year old female who has diabetes and is newly on Jardiance.  She has had chronic yeast infections secondary to diabetes for some time.  Her provider talked to her about using Jardiance and provided both Cipro and a warning about possible yeast infections due to [place] use.  She was told Bernadine might cause a UTI also and to use the Cipro if needed.  She has taken 1 dose of Jardiance but then stopped because she feels like it is already giving her side effects.  She had a Pap smear within the last few weeks and it was normal but did show some yeast infection.  She has a thick white discharge vaginally and quite a bit of vaginal irritation and itch.  She thinks she has an acute yeast infection.  She did take Cipro 500 mg earlier this morning in case she had a UTI.  She is having urinary frequency and then with the vaginal symptoms but no fever.   Urinary Frequency Pertinent negatives include no chest pain, no abdominal pain and no shortness of breath.    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, antepartum 04/14/2016   [redacted] weeks gestation of pregnancy    Advanced maternal age in multigravida    Hereditary disease in family possibly affecting fetus, affecting management of mother,  antepartum condition or complication     Past Surgical History:  Procedure Laterality Date   ANTERIOR FUSION CERVICAL SPINE     CLAVICLE EXCISION Right    SHOULDER ARTHROSCOPY Right     OB History     Gravida  1   Para      Term      Preterm      AB      Living         SAB      IAB      Ectopic      Multiple      Live Births               Home Medications    Prior to Admission medications   Medication Sig Start Date End Date Taking? Authorizing Provider  Continuous Glucose Sensor (FREESTYLE LIBRE 3 PLUS SENSOR) MISC USE AS DIRECTED EVERY 15 DAYS TO MONITOR BLOOD SUGAR CONTINUOSLY 02/08/24  Yes [provider]  fluconazole  (DIFLUCAN ) 150 MG tablet By mouth today and repeat every 5 days for vaginal yeast infection.  May use up to four pills, if needed 02/21/24  Yes Ival Domino, FNP  Insulin Aspart FlexPen (NOVOLOG) 100 UNIT/ML Inject 5 units three times daily with meals plus 1 unit for every 50 above 150 mg/dL. Use up to 50 units per day.  12/30/23  Yes [provider]  JARDIANCE 25 MG TABS tablet Take 25 mg by mouth daily. 02/15/24  Yes [provider]  metFORMIN (GLUCOPHAGE-XR) 500 MG 24 hr tablet Take by mouth. 12/30/23 03/29/24 Yes [provider]  nystatin  cream (MYCOSTATIN ) Apply an thin coat to the vaginal area up to 4 times daily if needed for vaginal itch or irritation 02/21/24  Yes Ival Domino, FNP  rosuvastatin (CRESTOR) 20 MG tablet Take 20 mg by mouth at bedtime. 01/14/24 01/13/25 Yes [provider]  TRESIBA FLEXTOUCH 100 UNIT/ML FlexTouch Pen SMARTSIG:20 Unit(s) SUB-Q Every Morning 02/05/24  Yes [provider]  acetaminophen (TYLENOL) 500 MG tablet Take 1,000 mg by mouth every 4 (four) hours as needed. 05/17/18   [provider]  albuterol (VENTOLIN HFA) 108 (90 Base) MCG/ACT inhaler Inhale 2 puffs into the lungs every 4 (four) hours as needed. 10/07/22 09/23/24  [provider]  aspirin  EC 81 MG tablet Take 81 mg by mouth.    [provider]  azelastine (ASTELIN) 0.1 % nasal spray Place 1 spray into both nostrils 2 (two) times daily.    [provider]  budeson-glycopyrrolate-formoterol (BREZTRI AEROSPHERE) 160-9-4.8 MCG/ACT AERO inhaler Inhale 2 puffs into the lungs 2 (two) times daily. Patient not taking: Reported on 10/27/2023 01/06/23   [provider]  busPIRone (BUSPAR) 5 MG tablet Take 5 mg by mouth 3 (three) times daily. 02/23/23   [provider]  Continuous Glucose Receiver (DEXCOM G7 RECEIVER) DEVI Use as directed for continuous glucose monitoring. 06/24/22   [provider]  cyclobenzaprine (FLEXERIL) 5 MG tablet Take 5 mg by mouth 3 (three) times daily as needed. Patient not taking: Reported on 10/27/2023    [provider]  EPINEPHrine 0.3 mg/0.3 mL IJ SOAJ injection 0.3 mg as needed. 02/06/21   [provider]  gentamicin (GARAMYCIN) 0.3 % ophthalmic solution Place 1 drop into the right eye 3 (three) times daily. 10/27/23   [provider]  ibuprofen (ADVIL) 200 MG tablet Take 200 mg by mouth 3 (three) times daily. 05/07/11   [provider]  insulin aspart (NOVOLOG) 100 UNIT/ML injection For use in insulin pump. Use at least 3 times a day. Use up to 100 units per day. 09/03/23 09/02/24  [provider]  Insulin Disposable Pump (OMNIPOD 5 DEXG7G6 PODS GEN 5) MISC Inject into the skin. 09/03/23   [provider]  Insulin Disposable Pump (OMNIPOD 5 DEXG7G6 PODS GEN 5) MISC SMARTSIG:SUB-Q Every 3 Days    [provider]  Insulin Glargine (BASAGLAR KWIKPEN) 100 UNIT/ML Inject 24 units when off insulin pump 09/12/22   [provider]  Insulin Pen Needle (BD PEN NEEDLE NANO U/F) 32G X 4 MM MISC Use one needle with insulin once per day 07/19/21   [provider]  Lancets (ONETOUCH DELICA PLUS LANCET33G) MISC USE ONCE DAILY TO CHECK BLOOD SUGAR 06/13/20   [provider]  montelukast (SINGULAIR) 10 MG tablet Take 1 tablet by mouth at bedtime. 10/27/23 02/24/24  [provider]  omeprazole (PRILOSEC) 40 MG capsule Take 1 capsule by mouth daily. Patient not taking: Reported on 11/10/2023 10/15/22   [provider]  ondansetron (ZOFRAN) 4 MG tablet Take 4 mg by mouth every 6 (six) hours as needed. Patient not taking: Reported on 11/10/2023 10/15/21   [provider]  ONETOUCH ULTRA test strip FOR USE UP TO FOUR TIMES DAILY FOR FINGER STICK BLOOD GLUCOSE MONITORING    [provider]  traZODone (DESYREL) 50 MG tablet Take 1 tablet by mouth at bedtime. 01/27/23 01/27/24  [provider]    Family History Family History  Problem Relation Age of Onset   Diabetes Mother    Breast cancer Mother 26       d. 7   Heart disease Mother    Rheum arthritis Mother    Hypertension Father    Epilepsy Father    Biliary Cirrhosis Father    Cancer Father 33       biliary   Breast cancer Sister        dx 41s; mets; d. 44s; mat half sister   Breast cancer Maternal Aunt        dx >50   Melanoma Maternal Uncle        back/neck; dx >50; no systemic therapy   Rheum arthritis Maternal Grandmother    Heart disease Maternal Grandmother    Hypertension Maternal Grandmother    Diabetes Maternal Grandmother    Breast cancer Maternal Grandmother        dx 65s   Cancer Maternal Grandfather        unknown type; tumor on neck; dx >50   Thyroid cancer Cousin        mat female cousin    Social History Social History   Tobacco Use   Smoking status: Every Day    Types: Cigarettes   Smokeless tobacco: Never  Vaping Use   Vaping status: Never Used  Substance Use Topics   Alcohol use: Never   Drug use: Never     Allergies   Citalopram, Gabapentin, Iodine-131, Meloxicam, Sulfamethoxazole, Other, Penicillins, Phosphorated carbohyd-caff, Shellfish-derived products, Codeine, and Topiramate   Review of Systems Review of  Systems  Constitutional:  Negative for chills and fever.  HENT:  Negative for ear pain and sore throat.   Eyes:  Negative for pain and visual disturbance.  Respiratory:  Negative for cough and shortness of breath.   Cardiovascular:  Negative for chest pain and palpitations.  Gastrointestinal:  Negative for abdominal pain, constipation, diarrhea, nausea and vomiting.  Genitourinary:  Positive for frequency, vaginal discharge and vaginal pain. Negative for dysuria and hematuria.  Musculoskeletal:  Negative for arthralgias and back pain.  Skin:  Negative for color change and rash.  Neurological:  Negative for seizures and syncope.  All other systems reviewed and are negative.    Physical Exam Triage Vital Signs ED Triage Vitals  Encounter Vitals Group     BP 02/21/24 1215 115/81     Girls Systolic BP Percentile --      Girls Diastolic BP Percentile --      Boys Systolic BP Percentile --      Boys Diastolic BP Percentile --      Pulse Rate 02/21/24 1215 89     Resp 02/21/24 1215 18     Temp 02/21/24 1215 98.1 F (36.7 C)     Temp Source 02/21/24 1215 Oral     SpO2 02/21/24 1215 95 %     Weight --      Height --      Head Circumference --      Peak Flow --      Pain Score 02/21/24 1211 0     Pain Loc --      Pain Education --      Exclude from Growth Chart --    No data found.  Updated Vital Signs BP 115/81 (BP Location: Right Arm)  Pulse 89   Temp 98.1 F (36.7 C) (Oral)   Resp 18   SpO2 95%   Visual Acuity Right Eye Distance:   Left Eye Distance:   Bilateral Distance:    Right Eye Near:   Left Eye Near:    Bilateral Near:     Physical Exam Vitals and nursing note reviewed.  Constitutional:      General: She is not in acute distress.    Appearance: She is well-developed. She is not ill-appearing or toxic-appearing.  HENT:     Head: Normocephalic and atraumatic.     Right Ear: Hearing, tympanic membrane, ear canal and external ear normal.     Left Ear:  Hearing, tympanic membrane, ear canal and external ear normal.     Nose: No congestion or rhinorrhea.     Right Sinus: No maxillary sinus tenderness or frontal sinus tenderness.     Left Sinus: No maxillary sinus tenderness or frontal sinus tenderness.     Mouth/Throat:     Lips: Pink.     Mouth: Mucous membranes are moist.     Dentition: Abnormal dentition. Dental tenderness, gingival swelling and dental caries present.     Pharynx: Uvula midline. No oropharyngeal exudate or posterior oropharyngeal erythema.     Tonsils: No tonsillar exudate.  Eyes:     Conjunctiva/sclera: Conjunctivae normal.     Pupils: Pupils are equal, round, and reactive to light.  Cardiovascular:     Rate and Rhythm: Normal rate and regular rhythm.     Heart sounds: S1 normal and S2 normal. No murmur heard. Pulmonary:     Effort: Pulmonary effort is normal. No respiratory distress.     Breath sounds: Normal breath sounds. No decreased breath sounds, wheezing, rhonchi or rales.  Abdominal:     General: Bowel sounds are normal.     Palpations: Abdomen is soft.     Tenderness: There is abdominal tenderness in the right lower quadrant, suprapubic area and left lower quadrant. There is no right CVA tenderness, left CVA tenderness, guarding or rebound. Negative signs include Murphy's sign, Rovsing's sign and McBurney's sign.  Genitourinary:    Comments: Exam declined today.  She will return if symptoms persist. Musculoskeletal:        General: No swelling.     Cervical back: Neck supple.  Lymphadenopathy:     Head:     Right side of head: No submental, submandibular, tonsillar, preauricular or posterior auricular adenopathy.     Left side of head: No submental, submandibular, tonsillar, preauricular or posterior auricular adenopathy.     Cervical: No cervical adenopathy.     Right cervical: No superficial cervical adenopathy.    Left cervical: No superficial cervical adenopathy.  Skin:    General: Skin is warm  and dry.     Capillary Refill: Capillary refill takes less than 2 seconds.     Findings: No rash.  Neurological:     Mental Status: She is alert and oriented to person, place, and time.  Psychiatric:        Mood and Affect: Mood normal.      UC Treatments / Results  Labs (all labs ordered are listed, but only abnormal results are displayed) Labs Reviewed - No data to display  EKG   Radiology No results found.  Procedures Procedures (including critical care time)  Medications Ordered in UC Medications - No data to display  Initial Impression / Assessment and Plan / UC Course  I have reviewed  the triage vital signs and the nursing notes.  Pertinent labs & imaging results that were available during my care of the patient were reviewed by me and considered in my medical decision making (see chart for details).  Plan of Care: Vaginal yeast infection: Based on patient's reported symptoms, will treat for yeast infection.  Fluconazole  150 mg now and repeat every 5 to 7 days, up to a total of 4 pills over 3 weeks.  Use nystatin  cream, and thin amounts vaginally, up to 4 times daily if needed for vaginal itch or irritation.  Educated on use of Jardiance and the need for very good hygiene to reduce the risk of yeast infection from the glucosuria.  Will return for recheck if symptoms persist and we will get a pelvic exam at that time.  Jaw pain secondary to enlarged lymph nodes and dental caries: Patient has Cipro from primary care for possible UTI symptoms.  She started it this morning.  Urinalysis not done due to her taking 500 mg Cipro 4 hours before the visit.  Encouraged to complete the Cipro because that should help the irritation or infection associated with her dental caries and the enlarged lymph node should improve.  Follow-up if symptoms do not improve, worsen or new symptoms occur.  I reviewed the plan of care with the patient and/or the patient's guardian.  The patient and/or  guardian had time to ask questions and acknowledged that the questions were answered.  I provided instruction on symptoms or reasons to return here or to go to an ER, if symptoms/condition did not improve, worsened or if new symptoms occurred.  Final Clinical Impressions(s) / UC Diagnoses   Final diagnoses:  Vaginal candidiasis  Dental caries  Jaw pain, non-TMJ     Discharge Instructions      Vaginal yeast infection: Educated about causes of yeast infection and treatment options.  Advised about the side effects of use of Jardiance and the need for significant hygiene efforts to reduce risk of yeast infections due to glucosuria from [place].  Apply thin amounts of nystatin  cream vaginally up to 4 times daily if needed for itch or irritation.  Fluconazole  150 mg 1 pill now and repeat every 5 to 7 days up to a total of 4 pills over 3 weeks.  Jaw pain secondary to enlarged lymph nodes and dental caries: Patient has only lower front teeth but she has significant dental caries.  She has some lymphadenopathy in her neck and jaw.  She has Cipro from her primary care for possible UTI and I advised her to complete the Cipro as it might help some of this oral infection.  Follow-up with primary care as needed.  Follow-up with dentist about the dental caries.  Follow-up here if needed.     ED Prescriptions     Medication Sig Dispense Auth. Provider   fluconazole  (DIFLUCAN ) 150 MG tablet By mouth today and repeat every 5 days for vaginal yeast infection.  May use up to four pills, if needed 4 tablet Ival Domino, FNP   nystatin  cream (MYCOSTATIN ) Apply an thin coat to the vaginal area up to 4 times daily if needed for vaginal itch or irritation 30 g Ival Domino, FNP      PDMP not reviewed this encounter.   Ival Domino, FNP 02/21/24 1348

## 2024-05-09 ENCOUNTER — Other Ambulatory Visit: Payer: Self-pay | Admitting: Hematology and Oncology

## 2024-05-09 DIAGNOSIS — D72829 Elevated white blood cell count, unspecified: Secondary | ICD-10-CM

## 2024-05-09 NOTE — Progress Notes (Unsigned)
 [facility]  [address] [place],  [place]  [address] [phone]  Clinic Day:  05/11/2024  Referring physician: Hunter Mickey Browner, PA-C   HISTORY OF PRESENT ILLNESS:  The patient is a 53 y.o. female with leukocytosis felt to be most likely due to cigarette smoking and chronic lung disease. Testing for CML and CLL was negative.  She had a positive ANA 1:320 in 2024 and was seen by rheumatology. Further evaluation was otherwise negative.  At her consultation visit, she had cough for several weeks.  She denied unintentional weight loss or hemoptysis.  She has not yet had low-dose CT chest for lung cancer screening.  Due to her family history of malignancy, I recommend referral to genetic counseling for consideration of testing for hereditary cancer syndromes. Genetic testing with the [facility] did not reveal any clinically significant mutation or variant of uncertain significance. Her lifetime risk for breast cancer based on Tyrer-Cuzick risk model is 23.3%.  MRI brain in April revealed no evidence of acute intracranial abnormality.  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   She is here today for repeat clinical assessment.  She continues to smoke 1 pack of cigarettes daily.  Since her last visit, she has not tried quitting.  In the past she has used gum and lozenges, but these caused her heartburn.  She has not tried the patches due to cost.  She reports significant anxiety, which Chantix or Zyban could potentially worsen.  She reports a decreased appetite, but she is not losing weight.  She denies hemoptysis.  She asked me to look at a skin lesion behind her right ear.  She states she is scheduled for bilateral mammogram and MRI breast on November 5 at [facility].  She has not had a low-dose CT chest for lung cancer screening yet.  She states her blood sugars have been better.  She reports low back pain today, for which she saw Dr. Gust  earlier this month. 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 He discussed getting MRI lumbar spine before considering injections, but she opted for physical therapy instead.  Dr. Gust gave her a steroid injection in September for neck and left arm pain with resolution of pain. MRI cervical spine in October, which revealed previous fusion at C5-C7 with progression of degenerative disc and joint disease at these levels, particularly C5-C6.  PHYSICAL EXAM:  Blood pressure 112/75, pulse 79, temperature 98.8 F (37.1 C), temperature source Oral, resp. rate 20, height 5' 3.9 (1.623 m), weight 152 lb 12.8 oz (69.3 kg), SpO2 96%, unknown if currently breastfeeding. Wt Readings from Last 3 Encounters:  05/11/24 152 lb 12.8 oz (69.3 kg)  11/10/23 157 lb 14.4 oz (71.6 kg)  10/27/23 157 lb 3.2 oz (71.3 kg)   Body mass index is 26.31 kg/m.  Performance status (ECOG): 1 - Symptomatic but completely ambulatory  Physical Exam Vitals and nursing note reviewed.  Constitutional:      General: She is not in acute distress.    Appearance: She is normal weight.  HENT:     Head: Normocephalic and atraumatic.     Mouth/Throat:     Mouth: Mucous membranes are moist.     Pharynx: Oropharynx is clear. No oropharyngeal exudate or posterior oropharyngeal erythema.  Eyes:     General: No scleral icterus.    Extraocular Movements: Extraocular movements intact.     Conjunctiva/sclera: Conjunctivae normal.  Pupils: Pupils are equal, round, and reactive to light.  Cardiovascular:     Rate and Rhythm: Normal rate and regular rhythm.     Heart sounds: Normal heart sounds. No murmur heard.    No friction rub. No gallop.  Pulmonary:     Effort: Pulmonary effort is normal.     Breath sounds: Normal breath sounds. No wheezing, rhonchi or rales.  Chest:  Breasts:    Right: Normal. No swelling, bleeding, inverted nipple, mass, nipple discharge, skin change or tenderness.      Left: Normal. No swelling, bleeding, inverted nipple, mass, nipple discharge, skin change or tenderness.  Abdominal:     General: There is no distension.     Palpations: Abdomen is soft. There is no hepatomegaly, splenomegaly or mass.     Tenderness: There is no abdominal tenderness.  Musculoskeletal:        General: Normal range of motion.     Cervical back: Normal range of motion and neck supple. No tenderness.     Right lower leg: No edema.     Left lower leg: No edema.  Lymphadenopathy:     Cervical: No cervical adenopathy.     Upper Body:     Right upper body: No supraclavicular or axillary adenopathy.     Left upper body: No supraclavicular or axillary adenopathy.     Lower Body: No right inguinal adenopathy. No left inguinal adenopathy.  Skin:    General: Skin is warm and dry.     Coloration: Skin is not jaundiced.     Findings: Lesion (Flat, oblong white brown lesion behind the right ear) present. No rash.  Neurological:     Mental Status: She is alert and oriented to person, place, and time.     Cranial Nerves: No cranial nerve deficit.  Psychiatric:        Mood and Affect: Mood normal.        Behavior: Behavior normal.        Thought Content: Thought content normal.     LABS:      Latest Ref Rng & Units 05/11/2024    1:00 PM 10/27/2023    2:20 PM  CBC  WBC 4.0 - 10.5 K/uL 14.6  14.9   Hemoglobin 12.0 - 15.0 g/dL 83.1  84.5   Hematocrit 36.0 - 46.0 % 50.7  45.7   Platelets 150 - 400 K/uL 312  355     Latest Reference Range & Units 05/11/24 13:00  Neutrophils % 60  Lymphocytes % 34  Monocytes Relative % 4  Eosinophil % 1  Basophil % 1  Immature Granulocytes % 0  NEUT# 1.7 - 7.7 K/uL 8.7 (H)  Lymphs Abs 0.7 - 4.0 K/uL 4.9 (H)  Monocyte # 0.1 - 1.0 K/uL 0.6  Eosinophils Absolute 0.0 - 0.5 K/uL 0.2  Basophils Absolute 0.0 - 0.1 K/uL 0.1  Abs Immature Granulocytes 0.00 - 0.07 K/uL 0.06  RBC Morphology  MORPHOLOGY UNREMARKABLE  WBC Morphology  MORPHOLOGY  UNREMARKABLE  Plt Morphology  Normal platelet morphology  (H): Data is abnormally high      STUDIES:  No results found.    ASSESSMENT & PLAN:   Assessment/Plan:  53 y.o. female with   Leukocytosis and erythrocytosis felt to be due to cigarette smoking.  Testing for myeloproliferative neoplasms was negative.  There is nothing concerning per her peripheral smear today.  Unfortunately, she continues to smoke 1 pack daily.    Tobacco abuse.  She has not had  a low-dose CT chest for lung cancer screening, so I will schedule that today.  We discussed the benefits versus risks and shared decision making and she agrees.  Discussed using nicotine patches to help with cravings.  I gave her information regarding the Panacea quit line.  She may be eligible for free patches through that program.  She understands the importance of complete abstinence from tobacco.  Family history of breast cancer.  Genetic testing was negative.  She has calculated lifetime risk of breast cancer of over 23%.   For females with a greater than 20% lifetime risk of breast cancer, the [employer] (NCCN) recommends the following:  A. Clinical encounter every 6-12 months to begin when identified as being at increased risk.  Bilateral breast exam is negative  B. Annual mammograms. Tomosynthesis is recommended starting 10 years earlier than the youngest breast cancer diagnosis in the family or at age 3.  She is scheduled for bilateral mammogram next week  C. Annual breast MRI starting 10 years earlier than the youngest breast cancer diagnosis in the family or at age 33.  She is scheduled for MRI breast next week  4.   Light brown skin lesion behind right ear, which appears benign.  Offered to send her to dermatology for skin cancer screening, but she declines.  She would like to monitor this area.    I will plan to see her back in 6 months for repeat clinical assessment.  The patient understands all the  plans discussed today and is in agreement with them.  She knows to contact our office if she develops concerns prior to her next appointment.     Andrez DELENA Foy, PA-C   Physician Assistant [facility] [phone]

## 2024-05-11 ENCOUNTER — Encounter: Payer: Self-pay | Admitting: Hematology and Oncology

## 2024-05-11 ENCOUNTER — Inpatient Hospital Stay

## 2024-05-11 ENCOUNTER — Inpatient Hospital Stay: Attending: Hematology and Oncology | Admitting: Hematology and Oncology

## 2024-05-11 ENCOUNTER — Other Ambulatory Visit: Payer: Self-pay

## 2024-05-11 VITALS — BP 112/75 | HR 79 | Temp 98.8°F | Resp 20 | Ht 63.9 in | Wt 152.8 lb

## 2024-05-11 DIAGNOSIS — D72829 Elevated white blood cell count, unspecified: Secondary | ICD-10-CM

## 2024-05-11 DIAGNOSIS — F17210 Nicotine dependence, cigarettes, uncomplicated: Secondary | ICD-10-CM | POA: Diagnosis not present

## 2024-05-11 DIAGNOSIS — Z803 Family history of malignant neoplasm of breast: Secondary | ICD-10-CM | POA: Diagnosis not present

## 2024-05-11 DIAGNOSIS — D751 Secondary polycythemia: Secondary | ICD-10-CM | POA: Diagnosis present

## 2024-05-11 DIAGNOSIS — L989 Disorder of the skin and subcutaneous tissue, unspecified: Secondary | ICD-10-CM | POA: Insufficient documentation

## 2024-05-11 DIAGNOSIS — Z720 Tobacco use: Secondary | ICD-10-CM

## 2024-05-11 LAB — CBC WITH DIFFERENTIAL (CANCER CENTER ONLY)
Abs Immature Granulocytes: 0.06 K/uL (ref 0.00–0.07)
Basophils Absolute: 0.1 K/uL (ref 0.0–0.1)
Basophils Relative: 1 %
Eosinophils Absolute: 0.2 K/uL (ref 0.0–0.5)
Eosinophils Relative: 1 %
HCT: 50.7 % — ABNORMAL HIGH (ref 36.0–46.0)
Hemoglobin: 16.8 g/dL — ABNORMAL HIGH (ref 12.0–15.0)
Immature Granulocytes: 0 %
Lymphocytes Relative: 34 %
Lymphs Abs: 4.9 K/uL — ABNORMAL HIGH (ref 0.7–4.0)
MCH: 29.5 pg (ref 26.0–34.0)
MCHC: 33.1 g/dL (ref 30.0–36.0)
MCV: 88.9 fL (ref 80.0–100.0)
Monocytes Absolute: 0.6 K/uL (ref 0.1–1.0)
Monocytes Relative: 4 %
Neutro Abs: 8.7 K/uL — ABNORMAL HIGH (ref 1.7–7.7)
Neutrophils Relative %: 60 %
Platelet Count: 312 K/uL (ref 150–400)
RBC: 5.7 MIL/uL — ABNORMAL HIGH (ref 3.87–5.11)
RDW: 14.1 % (ref 11.5–15.5)
WBC Count: 14.6 K/uL — ABNORMAL HIGH (ref 4.0–10.5)
nRBC: 0 % (ref 0.0–0.2)

## 2024-05-11 LAB — TECHNOLOGIST SMEAR REVIEW: Plt Morphology: NORMAL

## 2024-05-24 ENCOUNTER — Ambulatory Visit (HOSPITAL_BASED_OUTPATIENT_CLINIC_OR_DEPARTMENT_OTHER): Admitting: Radiology

## 2024-05-27 ENCOUNTER — Ambulatory Visit (INDEPENDENT_AMBULATORY_CARE_PROVIDER_SITE_OTHER)
Admission: RE | Admit: 2024-05-27 | Discharge: 2024-05-27 | Disposition: A | Discharge status: Home / Self Care | Source: Ambulatory Visit | Attending: Hematology and Oncology | Admitting: Hematology and Oncology

## 2024-05-27 DIAGNOSIS — Z720 Tobacco use: Secondary | ICD-10-CM

## 2024-05-27 DIAGNOSIS — F17210 Nicotine dependence, cigarettes, uncomplicated: Secondary | ICD-10-CM | POA: Diagnosis not present

## 2024-06-07 ENCOUNTER — Telehealth: Payer: Self-pay

## 2024-06-07 NOTE — Telephone Encounter (Signed)
 Left detailed message about CT results

## 2024-06-07 NOTE — Telephone Encounter (Signed)
-----   Message from Andrez DELENA Foy sent at 06/07/2024 12:13 PM EST ----- Please let her know her lung cancer screening was negative. Thanks

## 2024-06-16 NOTE — Telephone Encounter (Signed)
 Pt aware.

## 2024-06-16 NOTE — Telephone Encounter (Signed)
 Dr.robbins to stop taking the metformin at this time but needs to talk to her DM doctor and then if sugar continues to be low to stop the jardiance.

## 2024-06-25 ENCOUNTER — Emergency Department (HOSPITAL_BASED_OUTPATIENT_CLINIC_OR_DEPARTMENT_OTHER)
Admission: EM | Admit: 2024-06-25 | Discharge: 2024-06-26 | Disposition: A | Discharge status: Home / Self Care | Attending: Emergency Medicine | Admitting: Emergency Medicine

## 2024-06-25 ENCOUNTER — Emergency Department (HOSPITAL_BASED_OUTPATIENT_CLINIC_OR_DEPARTMENT_OTHER)

## 2024-06-25 ENCOUNTER — Other Ambulatory Visit: Payer: Self-pay

## 2024-06-25 ENCOUNTER — Encounter (HOSPITAL_BASED_OUTPATIENT_CLINIC_OR_DEPARTMENT_OTHER): Payer: Self-pay | Admitting: Emergency Medicine

## 2024-06-25 DIAGNOSIS — R29898 Other symptoms and signs involving the musculoskeletal system: Secondary | ICD-10-CM

## 2024-06-25 DIAGNOSIS — J4489 Other specified chronic obstructive pulmonary disease: Secondary | ICD-10-CM | POA: Insufficient documentation

## 2024-06-25 DIAGNOSIS — R000 Tachycardia, unspecified: Secondary | ICD-10-CM | POA: Diagnosis not present

## 2024-06-25 DIAGNOSIS — Z794 Long term (current) use of insulin: Secondary | ICD-10-CM | POA: Insufficient documentation

## 2024-06-25 DIAGNOSIS — R531 Weakness: Secondary | ICD-10-CM | POA: Diagnosis not present

## 2024-06-25 DIAGNOSIS — R0602 Shortness of breath: Secondary | ICD-10-CM | POA: Diagnosis not present

## 2024-06-25 DIAGNOSIS — Z7951 Long term (current) use of inhaled steroids: Secondary | ICD-10-CM | POA: Insufficient documentation

## 2024-06-25 DIAGNOSIS — E119 Type 2 diabetes mellitus without complications: Secondary | ICD-10-CM | POA: Diagnosis not present

## 2024-06-25 DIAGNOSIS — R0981 Nasal congestion: Secondary | ICD-10-CM | POA: Diagnosis not present

## 2024-06-25 DIAGNOSIS — Z7984 Long term (current) use of oral hypoglycemic drugs: Secondary | ICD-10-CM | POA: Diagnosis not present

## 2024-06-25 DIAGNOSIS — Z7982 Long term (current) use of aspirin: Secondary | ICD-10-CM | POA: Insufficient documentation

## 2024-06-25 DIAGNOSIS — R059 Cough, unspecified: Secondary | ICD-10-CM | POA: Diagnosis present

## 2024-06-25 LAB — CBC
HCT: 49.1 % — ABNORMAL HIGH (ref 36.0–46.0)
Hemoglobin: 16.6 g/dL — ABNORMAL HIGH (ref 12.0–15.0)
MCH: 30 pg (ref 26.0–34.0)
MCHC: 33.8 g/dL (ref 30.0–36.0)
MCV: 88.6 fL (ref 80.0–100.0)
Platelets: 276 K/uL (ref 150–400)
RBC: 5.54 MIL/uL — ABNORMAL HIGH (ref 3.87–5.11)
RDW: 13.4 % (ref 11.5–15.5)
WBC: 16.9 K/uL — ABNORMAL HIGH (ref 4.0–10.5)
nRBC: 0 % (ref 0.0–0.2)

## 2024-06-25 LAB — I-STAT VENOUS BLOOD GAS, ED
Acid-Base Excess: 3 mmol/L — ABNORMAL HIGH (ref 0.0–2.0)
Bicarbonate: 28.1 mmol/L — ABNORMAL HIGH (ref 20.0–28.0)
Calcium, Ion: 1.14 mmol/L — ABNORMAL LOW (ref 1.15–1.40)
HCT: 49 % — ABNORMAL HIGH (ref 36.0–46.0)
Hemoglobin: 16.7 g/dL — ABNORMAL HIGH (ref 12.0–15.0)
O2 Saturation: 76 %
Patient temperature: 98.1
Potassium: 3.6 mmol/L (ref 3.5–5.1)
Sodium: 140 mmol/L (ref 135–145)
TCO2: 29 mmol/L (ref 22–32)
pCO2, Ven: 41.6 mmHg — ABNORMAL LOW (ref 44–60)
pH, Ven: 7.436 — ABNORMAL HIGH (ref 7.25–7.43)
pO2, Ven: 39 mmHg (ref 32–45)

## 2024-06-25 LAB — BASIC METABOLIC PANEL WITH GFR
Anion gap: 16 — ABNORMAL HIGH (ref 5–15)
BUN: 17 mg/dL (ref 6–20)
CO2: 24 mmol/L (ref 22–32)
Calcium: 9.5 mg/dL (ref 8.9–10.3)
Chloride: 101 mmol/L (ref 98–111)
Creatinine, Ser: 0.72 mg/dL (ref 0.44–1.00)
GFR, Estimated: >60 mL/min (ref >60)
Glucose, Bld: 192 mg/dL — ABNORMAL HIGH (ref 70–99)
Potassium: 3.7 mmol/L (ref 3.5–5.1)
Sodium: 141 mmol/L (ref 135–145)

## 2024-06-25 LAB — URINALYSIS, ROUTINE W REFLEX MICROSCOPIC
Bilirubin Urine: NEGATIVE
Glucose, UA: >=500 mg/dL — AB
Hgb urine dipstick: NEGATIVE
Ketones, ur: NEGATIVE mg/dL
Leukocytes,Ua: NEGATIVE
Nitrite: NEGATIVE
Protein, ur: 30 mg/dL — AB
Specific Gravity, Urine: 1.015 (ref 1.005–1.030)
pH: 5.5 (ref 5.0–8.0)

## 2024-06-25 LAB — RESP PANEL BY RT-PCR (RSV, FLU A&B, COVID)  RVPGX2
Influenza A by PCR: NEGATIVE
Influenza B by PCR: NEGATIVE
Resp Syncytial Virus by PCR: NEGATIVE
SARS Coronavirus 2 by RT PCR: NEGATIVE

## 2024-06-25 LAB — URINALYSIS, MICROSCOPIC (REFLEX): RBC / HPF: NONE SEEN RBC/hpf (ref 0–5)

## 2024-06-25 LAB — MAGNESIUM: Magnesium: 2 mg/dL (ref 1.7–2.4)

## 2024-06-25 MED ORDER — SODIUM CHLORIDE 0.9 % IV BOLUS
1000.0000 mL | Freq: Once | INTRAVENOUS | Status: AC
  Administered 2024-06-25: 1000 mL via INTRAVENOUS

## 2024-06-25 MED ORDER — HYDROCOD POLI-CHLORPHE POLI ER 10-8 MG/5ML PO SUER
5.0000 mL | Freq: Once | ORAL | Status: AC
  Administered 2024-06-25: 5 mL via ORAL
  Filled 2024-06-25: qty 5

## 2024-06-25 NOTE — ED Provider Notes (Addendum)
 [facility] Provider Note   CSN: [id number] Arrival date & time: 06/25/24  1624     Patient presents with: Multiple Complaints    [name] is a 53 y.o. female w/ hx of type 2 diabetes, emphysema/smoking, here with SOB, cough, congestion.  Patient start on mounjaro 2 weeks ago, in addition to her daily Jiardiance, tresiba 20 units in AM, and novolog SS with meals.  She reports being diagnosed on telehealth with bronchitis and start on steroids and doxycycline 5 days ago and has been taking them with little relief.  Complaining of headache, congestion, sore throat, persistent coughing, shortness of breath, fatigue, weakness, says she is too weak to get out of bed for the past day.  She also noted a tremor in her arms and specifically in the right hand and feels that her grip strength is weaker on the right side.  Also ongoing for about 2 days.  She is here with her husband at bedside   HPI     Prior to Admission medications  Medication Sig Start Date End Date Taking? Authorizing Provider  acetaminophen (TYLENOL) 500 MG tablet Take 1,000 mg by mouth every 4 (four) hours as needed. 05/17/18   [provider]  albuterol (VENTOLIN HFA) 108 (90 Base) MCG/ACT inhaler Inhale 2 puffs into the lungs every 4 (four) hours as needed. 10/07/22 09/23/24  [provider]  aspirin EC 81 MG tablet Take 81 mg by mouth.    [provider]  azelastine (ASTELIN) 0.1 % nasal spray Place 1 spray into both nostrils 2 (two) times daily.    [provider]  busPIRone (BUSPAR) 5 MG tablet Take 5 mg by mouth 3 (three) times daily. Patient not taking: Reported on 05/11/2024 02/23/23   [provider]  Continuous Glucose Receiver (DEXCOM G7 RECEIVER) DEVI Use as directed for continuous glucose monitoring. 06/24/22   [provider]  Continuous Glucose Sensor (FREESTYLE LIBRE 3 PLUS SENSOR) MISC USE AS DIRECTED  EVERY 15 DAYS TO MONITOR BLOOD SUGAR CONTINUOSLY 02/08/24   [provider]  EPINEPHrine 0.3 mg/0.3 mL IJ SOAJ injection 0.3 mg as needed. 02/06/21   [provider]  gentamicin (GARAMYCIN) 0.3 % ophthalmic solution Place 1 drop into the right eye 3 (three) times daily. 10/27/23   [provider]  ibuprofen (ADVIL) 200 MG tablet Take 200 mg by mouth 3 (three) times daily. 05/07/11   [provider]  Insulin Aspart FlexPen (NOVOLOG) 100 UNIT/ML Inject 5 units three times daily with meals plus 1 unit for every 50 above 150 mg/dL. Use up to 50 units per day. 12/30/23   [provider]  Insulin Disposable Pump (OMNIPOD 5 DEXG7G6 PODS GEN 5) MISC Inject into the skin. 09/03/23   [provider]  Insulin Disposable Pump (OMNIPOD 5 DEXG7G6 PODS GEN 5) MISC SMARTSIG:SUB-Q Every 3 Days    [provider]  Insulin Pen Needle (BD PEN NEEDLE NANO U/F) 32G X 4 MM MISC Use one needle with insulin once per day 07/19/21   [provider]  JARDIANCE 25 MG TABS tablet Take 25 mg by mouth daily. 02/15/24   [provider]  Lancets (ONETOUCH DELICA PLUS LANCET33G) MISC USE ONCE DAILY TO CHECK BLOOD SUGAR 06/13/20   [provider]  metFORMIN (GLUCOPHAGE-XR) 500 MG 24 hr tablet Take by mouth. 12/30/23 05/11/24  [provider]  nystatin  cream (MYCOSTATIN ) Apply an thin coat to the vaginal area up  to 4 times daily if needed for vaginal itch or irritation 02/21/24   Ival Domino, FNP  [facility] ULTRA test strip FOR USE UP TO FOUR TIMES DAILY FOR FINGER STICK BLOOD GLUCOSE MONITORING    [provider]  rosuvastatin (CRESTOR) 20 MG tablet Take 20 mg by mouth at bedtime. 01/14/24 01/13/25  [provider]  TRESIBA FLEXTOUCH 100 UNIT/ML FlexTouch Pen SMARTSIG:20 Unit(s) SUB-Q Every Morning 02/05/24   [provider]    Allergies: Citalopram, Gabapentin, Iodine-131, Meloxicam, Sulfamethoxazole, Other, Penicillins,  Phosphorated carbohyd-caff, Shellfish protein-containing drug products, Ciprofloxacin, Codeine, and Topiramate    Review of Systems  Updated Vital Signs BP (!) 128/95 (BP Location: Left Arm)   Pulse 85   Temp 98.1 F (36.7 C) (Oral)   Resp 17   Ht 5' 4 (1.626 m)   SpO2 94%   BMI 26.23 kg/m   Physical Exam Constitutional:      General: She is not in acute distress. HENT:     Head: Normocephalic and atraumatic.  Eyes:     Conjunctiva/sclera: Conjunctivae normal.     Pupils: Pupils are equal, round, and reactive to light.  Cardiovascular:     Rate and Rhythm: Regular rhythm. Tachycardia present.  Pulmonary:     Effort: Pulmonary effort is normal. No respiratory distress.     Comments: Persistent dry coughing, no audible wheezing on exam, 96% on room air O2 saturaiton Abdominal:     General: There is no distension.     Tenderness: There is no abdominal tenderness.  Skin:    General: Skin is warm and dry.  Neurological:     Mental Status: She is alert. Mental status is at baseline.     Comments: Weakened grip strength and finger abduction & adduction strength in the right hand compared to left, 4 out of 5 strength.  No wrist drop. No abnormal pronator drift.  No other evident neurological deficits or weakness found on facial exam or lower extremity exam.  Psychiatric:        Mood and Affect: Mood normal.        Behavior: Behavior normal.     (all labs ordered are listed, but only abnormal results are displayed) Labs Reviewed  BASIC METABOLIC PANEL WITH GFR - Abnormal; Notable for the following components:      Result Value   Glucose, Bld 192 (*)    Anion gap 16 (*)    All other components within normal limits  CBC - Abnormal; Notable for the following components:   WBC 16.9 (*)    RBC 5.54 (*)    Hemoglobin 16.6 (*)    HCT 49.1 (*)    All other components within normal limits  URINALYSIS, ROUTINE W REFLEX MICROSCOPIC - Abnormal; Notable for the following components:    Glucose, UA >=500 (*)    Protein, ur 30 (*)    All other components within normal limits  URINALYSIS, MICROSCOPIC (REFLEX) - Abnormal; Notable for the following components:   Bacteria, UA RARE (*)    All other components within normal limits  I-STAT VENOUS BLOOD GAS, ED - Abnormal; Notable for the following components:   pH, Ven 7.436 (*)    pCO2, Ven 41.6 (*)    Bicarbonate 28.1 (*)    Acid-Base Excess 3.0 (*)    Calcium, Ion 1.14 (*)    HCT 49.0 (*)    Hemoglobin 16.7 (*)    All other components within normal limits  RESP PANEL BY RT-PCR (RSV, FLU  A&B, COVID)  RVPGX2  MAGNESIUM    EKG: None  Radiology: DG Chest Portable 1 View Result Date: 06/25/2024 CLINICAL DATA:  Shortness of breath, cough, nausea, and headache. History of COPD and asthma. EXAM: PORTABLE CHEST 1 VIEW COMPARISON:  05/17/2023. FINDINGS: The heart size and mediastinal contours are within normal limits. Atherosclerotic calcification of the aorta is noted. No consolidation, effusion, or pneumothorax is seen. Cervical spinal fusion hardware is present. IMPRESSION: No active disease. Electronically Signed   By: Leita Birmingham M.D.   On: 06/25/2024 17:17     Procedures   Medications Ordered in the ED  chlorpheniramine-HYDROcodone (TUSSIONEX) 10-8 MG/5ML suspension 5 mL (5 mLs Oral Given 06/25/24 1704)  sodium chloride  0.9 % bolus 1,000 mL (1,000 mLs Intravenous New Bag/Given 06/25/24 1717)    Clinical Course as of 06/25/24 1859  Sat Jun 25, 2024  1742 WBC elevated in setting of concurrent prednisone use, likely related to this medication [MT]    Clinical Course User Index [MT] Olinda Nola, Donnice PARAS, MD                                 Medical Decision Making Amount and/or Complexity of Data Reviewed Labs: ordered. Radiology: ordered.  Risk Prescription drug management.   This patient presents to the ED with concern for constellation of symptoms as noted above. This involves an extensive number of  treatment options, and is a complaint that carries with it a high risk of complications and morbidity.  The differential diagnosis includes viral URI including RSV or influenza versus bacterial infection versus diabetic or metabolic complication versus other  This may also be a side effect to Mounjaro, which she started 2 weeks ago, specifically she reported she had low blood sugars initially, sugars as low as 50s.  She has a glucose monitor that she wears permanently.  However in the past week she has been on prednisone steroids and her sugars have been higher between 100 and 200.  I do not believe that hypoglycemia alone would account for all of her symptoms.  More likely I think she has a underlying infection and may be exacerbating her blood sugar lability.  Co-morbidities that complicate the patient evaluation: Brittle type 2 diabetes and multiple medications to control blood sugar  Additional history obtained from patient's husband at the bedside  I ordered and personally interpreted labs.  The pertinent results include: White blood cell count is elevated, likely related to ongoing prednisone use and patient reported chronic high white blood cell count.  Lower suspicion for sepsis.  No evidence of DKA, very minor anion gap  I ordered imaging studies including x-ray of the chest I independently visualized and interpreted imaging which showed no emergent findings I agree with the radiologist interpretation  The patient was maintained on a cardiac monitor.  I personally viewed and interpreted the cardiac monitored which showed an underlying rhythm of: Sinus rhythm   I ordered medication including IV fluid bolus for hydration, cough medication  I have reviewed the patients home medicines and have made adjustments as needed  Test Considered: Lower suspicion for acute PE, acute meningitis, subarachnoid hemorrhage, other life-threatening condition   After the interventions noted above, I  reevaluated the patient and found that they have: improved -coughing improved on reassessment  I suspect that the patient is likely dealing with an ongoing viral syndrome, for which she has already been treated with a course of  steroids and is now on day 5 of 7 of doxycycline.  She can continue and complete her antibiotic regimen at home, for possible underlying pulmonary infection is not well-visualized on chest x-ray.  I do not think she needs new or additional antibiotics.  Have a low suspicion for bacteremia or sepsis.  The patient does have persistent right hand weakness with grip strength.  This does not localize to a single dermatome to relate to cervical impingement, nor is the radiculopathy.  No prior history of carpal tunnel and I cannot elicit a clear peripheral neuropathy as a cause.  Therefore I think the safest plan of option would be to fully evaluate for stroke, given her risk factors including her diabetes.  She is outside the window for stroke activation with symptom onset nearly 24 hours ago.  No evidence of LVO.  Patient does have iodine allergies, so we will defer on CT angiogram imaging at this time.  If she has evidence of stroke on MRI; she may need MR angiogram or additional testing done inpatient, which can be discussed with neurology.  I did discuss and offer them the option of supervised ambulance transportation to [facility], explained this would be the safest option, but they prefer to go by private vehicle.  They understand the risk of going private car can include worsening stroke symptoms, weakness, death.   Dr Lavonia EDP at [facility] accepting provider      Final diagnoses:  Right hand weakness  Cough, unspecified type    ED Discharge Orders     None          Teiara Baria, Donnice PARAS, MD 06/25/24 1859    Cottie Donnice PARAS, MD 06/25/24 1905

## 2024-06-25 NOTE — ED Notes (Addendum)
 Patient grabbed this RN in the lobby and asked for her PICC line to be removed. RN explained that IV team must take out a PICC line and she was upset she hasn't to MRI yet. She stated I will take it out when I get home and threw her papers at the man with her and walked out the door. Charge nurse notified.

## 2024-06-25 NOTE — ED Triage Notes (Signed)
 Pt sts she has been feeling shaky for about 1 wk; RT hand feels weak since last night, could not light cigarette d/t weakness; has spoken to telehealth multiple times this week; c/o HA

## 2024-08-05 ENCOUNTER — Ambulatory Visit (HOSPITAL_BASED_OUTPATIENT_CLINIC_OR_DEPARTMENT_OTHER)
Admission: EM | Admit: 2024-08-05 | Discharge: 2024-08-05 | Disposition: A | Discharge status: Home / Self Care | Attending: Family Medicine | Admitting: Family Medicine

## 2024-08-05 ENCOUNTER — Encounter (HOSPITAL_BASED_OUTPATIENT_CLINIC_OR_DEPARTMENT_OTHER): Payer: Self-pay

## 2024-08-05 ENCOUNTER — Other Ambulatory Visit (HOSPITAL_BASED_OUTPATIENT_CLINIC_OR_DEPARTMENT_OTHER): Payer: Self-pay

## 2024-08-05 DIAGNOSIS — N898 Other specified noninflammatory disorders of vagina: Secondary | ICD-10-CM | POA: Diagnosis present

## 2024-08-05 DIAGNOSIS — Z7721 Contact with and (suspected) exposure to potentially hazardous body fluids: Secondary | ICD-10-CM | POA: Diagnosis present

## 2024-08-05 DIAGNOSIS — R300 Dysuria: Secondary | ICD-10-CM | POA: Insufficient documentation

## 2024-08-05 DIAGNOSIS — B3731 Acute candidiasis of vulva and vagina: Secondary | ICD-10-CM | POA: Diagnosis present

## 2024-08-05 DIAGNOSIS — R102 Pelvic and perineal pain unspecified side: Secondary | ICD-10-CM | POA: Insufficient documentation

## 2024-08-05 DIAGNOSIS — N390 Urinary tract infection, site not specified: Secondary | ICD-10-CM | POA: Insufficient documentation

## 2024-08-05 DIAGNOSIS — Z114 Encounter for screening for human immunodeficiency virus [HIV]: Secondary | ICD-10-CM | POA: Diagnosis present

## 2024-08-05 LAB — POCT URINE DIPSTICK
Bilirubin, UA: NEGATIVE
Blood, UA: NEGATIVE
Glucose, UA: >=1000 mg/dL — AB
Leukocytes, UA: NEGATIVE
Nitrite, UA: NEGATIVE
Protein Ur, POC: 30 mg/dL — AB
Spec Grav, UA: 1.015
Urobilinogen, UA: 0.2 U/dL
pH, UA: 5

## 2024-08-05 MED ORDER — NITROFURANTOIN MONOHYD MACRO 100 MG PO CAPS
100.0000 mg | ORAL_CAPSULE | Freq: Two times a day (BID) | ORAL | 0 refills | Status: DC
  Filled 2024-08-05: qty 10, 5d supply, fill #0

## 2024-08-05 MED ORDER — FLUCONAZOLE 150 MG PO TABS
ORAL_TABLET | ORAL | 0 refills | Status: AC
  Filled 2024-08-05: qty 2, 5d supply, fill #0

## 2024-08-05 MED ORDER — CEPHALEXIN 500 MG PO CAPS
500.0000 mg | ORAL_CAPSULE | Freq: Four times a day (QID) | ORAL | 0 refills | Status: AC
  Filled 2024-08-05: qty 28, 7d supply, fill #0

## 2024-08-05 NOTE — ED Provider Notes (Addendum)
 " [name]    CSN: [id number] Arrival date & time: 08/05/24  1035      History   Chief Complaint Chief Complaint  Patient presents with   Dysuria    HPI [name] is a 54 y.o. female.   54 year old female who reports burning with urination, lower back pain, lower abdominal or pelvic pressure, hematuria and even some mild nausea.  Symptoms started on 07/31/2024.  On the night of 08/04/2024, when she went to the bathroom, she saw a little spots in the urine in the toilet that looked like blood.  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  She thinks she has a UTI.   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, antepartum 04/14/2016   [redacted] weeks gestation of pregnancy    Advanced maternal age in multigravida    Hereditary disease in family possibly affecting fetus, affecting management of mother, antepartum condition or complication     Past Surgical History:  Procedure Laterality Date   ANTERIOR FUSION CERVICAL SPINE     CLAVICLE EXCISION Right    SHOULDER ARTHROSCOPY Right     OB History     Gravida  1   Para      Term      Preterm      AB      Living         SAB      IAB      Ectopic      Multiple      Live Births               Home Medications    Prior to Admission medications   Medication Sig Start Date End Date Taking? Authorizing Provider  cephALEXin (KEFLEX) 500 MG capsule Take 1 capsule (500 mg total) by mouth 4 (four) times daily for 7 days. 08/05/24 08/12/24 Yes Ival Domino, FNP  fluconazole  (DIFLUCAN ) 150 MG tablet Take 1 tablet by mouth today and repeat in 5-7 days for vaginal yeast infection. 08/05/24  Yes Ival Domino, FNP  acetaminophen (TYLENOL) 500 MG tablet Take 1,000 mg by mouth every 4 (four) hours as needed. 05/17/18   [provider]  albuterol (VENTOLIN HFA) 108 (90 Base) MCG/ACT inhaler Inhale 2 puffs into the lungs every 4 (four) hours as needed. 10/07/22 09/23/24  [provider]  aspirin EC 81 MG tablet Take 81 mg by mouth.    [provider]  azelastine (ASTELIN) 0.1 % nasal spray Place 1 spray into both nostrils 2 (two) times daily.    [provider]  Continuous Glucose Receiver (DEXCOM G7 RECEIVER) DEVI Use as directed for continuous glucose monitoring. 06/24/22   [provider]  Continuous Glucose Sensor (FREESTYLE LIBRE 3  PLUS SENSOR) MISC USE AS DIRECTED EVERY 15 DAYS TO MONITOR BLOOD SUGAR CONTINUOSLY 02/08/24   [provider]  EPINEPHrine 0.3 mg/0.3 mL IJ SOAJ injection 0.3 mg as needed. 02/06/21   [provider]  gentamicin (GARAMYCIN) 0.3 % ophthalmic solution Place 1 drop into the right eye 3 (three) times daily. 10/27/23   [provider]  ibuprofen (ADVIL) 200 MG tablet Take 200 mg by mouth 3 (three) times daily. 05/07/11   [provider]  Insulin Aspart FlexPen (NOVOLOG) 100 UNIT/ML Inject 5 units three times daily with meals plus 1 unit for every 50 above 150 mg/dL. Use up to 50 units per day. 12/30/23   [provider]  Insulin Disposable Pump (OMNIPOD 5 DEXG7G6 PODS GEN 5) MISC Inject into the skin. 09/03/23   [provider]  Insulin Disposable Pump (OMNIPOD 5 DEXG7G6 PODS GEN 5) MISC SMARTSIG:SUB-Q Every 3 Days    [provider]   Insulin Pen Needle (BD PEN NEEDLE NANO U/F) 32G X 4 MM MISC Use one needle with insulin once per day 07/19/21   [provider]  JARDIANCE 25 MG TABS tablet Take 25 mg by mouth daily. 02/15/24   [provider]  Lancets (ONETOUCH DELICA PLUS LANCET33G) MISC USE ONCE DAILY TO CHECK BLOOD SUGAR 06/13/20   [provider]  metFORMIN (GLUCOPHAGE-XR) 500 MG 24 hr tablet Take by mouth. 12/30/23 05/11/24  [provider]  nystatin  cream (MYCOSTATIN ) Apply an thin coat to the vaginal area up to 4 times daily if needed for vaginal itch or irritation 02/21/24   Ival Domino, FNP  ONETOUCH ULTRA test strip FOR USE UP TO FOUR TIMES DAILY FOR FINGER STICK BLOOD GLUCOSE MONITORING    [provider]  rosuvastatin (CRESTOR) 20 MG tablet Take 20 mg by mouth at bedtime. 01/14/24 01/13/25  [provider]  TRESIBA FLEXTOUCH 100 UNIT/ML FlexTouch Pen SMARTSIG:20 Unit(s) SUB-Q Every Morning 02/05/24   [provider]    Family History Family History  Problem Relation Age of Onset   Diabetes Mother    Breast cancer Mother 35       d. 68   Heart disease Mother    Rheum arthritis Mother    Hypertension Father    Epilepsy Father    Biliary Cirrhosis Father    Cancer Father 23       biliary   Breast cancer Sister        dx 35s; mets; d. 52s; mat half sister   Breast cancer Maternal Aunt        dx >50   Melanoma Maternal Uncle        back/neck; dx >50; no systemic therapy   Rheum arthritis Maternal Grandmother    Heart disease Maternal Grandmother    Hypertension Maternal Grandmother    Diabetes Maternal Grandmother    Breast cancer Maternal Grandmother        dx 23s   Cancer Maternal Grandfather        unknown type; tumor on neck; dx >50   Thyroid cancer Cousin        mat female cousin    Social History Social History[1]   Allergies   Citalopram, Gabapentin, Iodine-131, Meloxicam, Sulfamethoxazole, Other, Nitrofuran derivatives,  Penicillins, Phosphorated carbohyd-caff, Shellfish protein-containing drug products, Ciprofloxacin, Codeine, and Topiramate   Review of Systems Review of Systems  Constitutional:  Negative for chills and fever.  HENT:  Negative for ear pain and sore throat.   Eyes:  Negative  for pain and visual disturbance.  Respiratory:  Negative for cough and shortness of breath.   Cardiovascular:  Negative for chest pain and palpitations.  Gastrointestinal:  Positive for abdominal pain and nausea. Negative for constipation, diarrhea and vomiting.  Genitourinary:  Positive for dysuria and hematuria.  Musculoskeletal:  Positive for back pain. Negative for arthralgias.  Skin:  Negative for color change and rash.  Neurological:  Negative for seizures and syncope.  All other systems reviewed and are negative.    Physical Exam Triage Vital Signs ED Triage Vitals  Encounter Vitals Group     BP 08/05/24 1047 106/77     Girls Systolic BP Percentile --      Girls Diastolic BP Percentile --      Boys Systolic BP Percentile --      Boys Diastolic BP Percentile --      Pulse Rate 08/05/24 1047 89     Resp 08/05/24 1047 20     Temp 08/05/24 1047 98.5 F (36.9 C)     Temp Source 08/05/24 1047 Oral     SpO2 08/05/24 1047 94 %     Weight --      Height --      Head Circumference --      Peak Flow --      Pain Score 08/05/24 1045 4     Pain Loc --      Pain Education --      Exclude from Growth Chart --    No data found.  Updated Vital Signs BP 106/77 (BP Location: Right Arm)   Pulse 89   Temp 98.5 F (36.9 C) (Oral)   Resp 20   SpO2 94%   Breastfeeding No   Visual Acuity Right Eye Distance:   Left Eye Distance:   Bilateral Distance:    Right Eye Near:   Left Eye Near:    Bilateral Near:     Physical Exam Vitals and nursing note reviewed. Exam conducted with a chaperone present Psychologist, [employer], CMA).  Constitutional:      General: She is not in acute distress.    Appearance: She is  well-developed. She is not ill-appearing, toxic-appearing or diaphoretic.  HENT:     Head: Normocephalic and atraumatic.     Right Ear: Hearing, tympanic membrane, ear canal and external ear normal.     Left Ear: Hearing, tympanic membrane, ear canal and external ear normal.     Nose: No congestion or rhinorrhea.     Right Sinus: No maxillary sinus tenderness or frontal sinus tenderness.     Left Sinus: No maxillary sinus tenderness or frontal sinus tenderness.     Mouth/Throat:     Lips: Pink.     Mouth: Mucous membranes are moist.     Pharynx: Uvula midline. No oropharyngeal exudate or posterior oropharyngeal erythema.     Tonsils: No tonsillar exudate.  Eyes:     Conjunctiva/sclera: Conjunctivae normal.     Pupils: Pupils are equal, round, and reactive to light.  Cardiovascular:     Rate and Rhythm: Normal rate and regular rhythm.     Heart sounds: S1 normal and S2 normal. No murmur heard. Pulmonary:     Effort: Pulmonary effort is normal. No respiratory distress.     Breath sounds: Normal breath sounds. No decreased breath sounds, wheezing, rhonchi or rales.  Abdominal:     General: Bowel sounds are normal.     Palpations: Abdomen is soft.     Tenderness:  There is abdominal tenderness (Abdominal pain is mild) in the periumbilical area and suprapubic area. There is left CVA tenderness. There is no right CVA tenderness, guarding or rebound. Negative signs include Murphy's sign, Rovsing's sign and McBurney's sign.     Hernia: There is no hernia in the left inguinal area or right inguinal area.  Genitourinary:    Exam position: Lithotomy position.     Labia:        Right: No rash, tenderness, lesion or injury.        Left: No rash, tenderness, lesion or injury.      Urethra: No prolapse, urethral pain, urethral swelling or urethral lesion.     Vagina: No signs of injury and foreign body. Vaginal discharge (Scant amount of cottage cheeselike white curds in the vagina.), erythema and  tenderness present. No bleeding, lesions or prolapsed vaginal walls.     Cervix: No cervical motion tenderness, discharge, friability, lesion, erythema, cervical bleeding or eversion.     Uterus: Normal.      Adnexa: Right adnexa normal and left adnexa normal.   Musculoskeletal:        General: No swelling.     Cervical back: Neck supple.  Lymphadenopathy:     Head:     Right side of head: No submental, submandibular, tonsillar, preauricular or posterior auricular adenopathy.     Left side of head: No submental, submandibular, tonsillar, preauricular or posterior auricular adenopathy.     Cervical: No cervical adenopathy.     Right cervical: No superficial cervical adenopathy.    Left cervical: No superficial cervical adenopathy.     Lower Body: No right inguinal adenopathy. No left inguinal adenopathy.  Skin:    General: Skin is warm and dry.     Capillary Refill: Capillary refill takes less than 2 seconds.     Findings: No rash.  Neurological:     Mental Status: She is alert and oriented to person, place, and time.  Psychiatric:        Mood and Affect: Mood normal.      UC Treatments / Results  Labs (all labs ordered are listed, but only abnormal results are displayed) Labs Reviewed  POCT URINE DIPSTICK - Abnormal; Notable for the following components:      Result Value   Glucose, UA >=1,000 (*)    Ketones, POC UA trace (5) (*)    Protein Ur, POC =30 (*)    All other components within normal limits  URINE CULTURE  HSV 1/2 PCR (SURFACE)  SYPHILIS: RPR W/REFLEX TO RPR TITER AND TREPONEMAL ANTIBODIES, TRADITIONAL SCREENING AND DIAGNOSIS ALGORITHM  HIV ANTIBODY (ROUTINE TESTING W REFLEX)  CERVICOVAGINAL ANCILLARY ONLY    EKG   Radiology No results found.  Procedures Procedures (including critical Woodard time)  Medications Ordered in UC Medications - No data to display  Initial Impression / Assessment and Plan / UC Course  I have reviewed the triage vital signs  and the nursing notes.  Pertinent labs & imaging results that were available during my Woodard of the patient were reviewed by me and considered in my medical decision making (see chart for details).  Plan of Woodard (see discharge instructions for additional patient precautions and education): UTI with dysuria and some hematuria noted yesterday:   Urinalysis is abnormal today but not clearly showing a UTI and no hematuria in this specimen..  Urine culture sent.  Will adjust the plan of Woodard, if needed once the urine culture results. Cephalexin, 500  mg, 1 pill 4 times daily with food for 7 days for probable UTI.  Patient was originally provided nitrofurantoin but when I spoke with her at discharge she reported that it causes itching.  It was not listed on her allergies but I have added it to her allergies.  She reports she has taken cephalexin without an allergy even though she has a penicillin allergy.   If the urine culture comes back negative we will contact the patient and tell her to stop the antibiotic.   Vaginal Candidiasis on exam:  Fluconazole , 150 mg, 1 pill now and repeat in 1 week.   Vaginal pain and a vaginal lesion with a concern for potential exposure to STIs:  Sexually Transmitted Infection Testing (STI):  The patient has some symptoms that could indicate an STI.  She was offered an STI swab that could test for gonorrhea, chlamydia, trichomonas, vaginal yeast, bacterial vaginosis.  She was offered blood testing for HIV and syphilis.  And, she was offered direct contact testing for herpes with a specific viral swab.  I reviewed the risks of these tests (infection, pain or bruising from having the blood drawn or pain from swabs and areas that are already tender and sore).  I reviewed the benefits of having these test (confirmation of specific diagnoses that help guide treatment).  I reviewed the alternatives of having these test (monitoring for symptoms and testing at a later date, if needed.   The patient elected to have the following tests done:gonorrhea, chlamydia, trichomonas, vaginal yeast, bacterial vaginosis, HIV, RPR (syphilis) and Herpes viral culture.    Will adjust the plan of Woodard, if needed, once her tests result.  Her exam showed clear signs of an acute Vaginal Yeast Infection and she will be started on Fluconazole . Follow-up with primary Woodard or return here if symptoms do not improve, worsen or new symptoms occur.  I reviewed the plan of Woodard with the patient and/or the patient's guardian.  The patient and/or guardian had time to ask questions and acknowledged that the questions were answered.   I spent 30 minutes on patient Woodard, including face to face time and Woodard planning/patient management.  Additional time spent in collection of medical history, review of possible testing options, collection of additional test, and discharge instructions. Final Clinical Impressions(s) / UC Diagnoses   Final diagnoses:  Dysuria  Urinary tract infection without hematuria, site unspecified  Vaginal pain  Vaginal lesion  Exposure to potentially hazardous body fluids  Encounter for screening for human immunodeficiency virus (HIV)  Vaginal candidiasis     Discharge Instructions      UTI with dysuria and some hematuria noted yesterday.  Urinalysis is abnormal today but not clearly showing a UTI.  Urine culture sent.  No blood in the urine today.  Will adjust the plan of Woodard, if needed once the urine culture results.  Cephalexin, 500 mg, 1 pill 4 times daily with food for 7 days for probable UTI.  Patient was originally provided nitrofurantoin but when I spoke with her at discharge she reported that it causes itching.  It was not listed on her allergies but I have added it to her allergies.  She reports she has taken cephalexin without an allergy even though she has a penicillin allergy.    If the urine culture comes back negative we will contact the patient and tell her to stop the  antibiotic.  Fluconazole , 150 mg, 1 pill now and repeat in 1 week.  Use  the fluconazole , if needed for vaginal yeast infection after or while using the antibiotic.  Patient is reporting vaginal pain and a vaginal lesion and concern for potential exposure to STIs.  Sexually Transmitted Infection Testing (STI): The patient has some symptoms that could indicate an STI.  She was offered an STI swab that could test for gonorrhea, chlamydia, trichomonas, vaginal yeast, bacterial vaginosis.  She was offered blood testing for HIV and syphilis.  And, she was offered direct contact testing for herpes with a specific viral swab.  I reviewed the risks of these tests (infection, pain or bruising from having the blood drawn or pain from swabs and areas that are already tender and sore).  I reviewed the benefits of having these test (confirmation of specific diagnoses that help guide treatment).  I reviewed the alternatives of having these test (monitoring for symptoms and testing at a later date, if needed.  The patient elected to have the following tests done:gonorrhea, chlamydia, trichomonas, vaginal yeast, bacterial vaginosis, HIV, RPR (syphilis) and Herpes viral culture.     Follow-up with primary Woodard or return here if symptoms do not improve, worsen or new symptoms occur.     ED Prescriptions     Medication Sig Dispense Auth. Provider   nitrofurantoin, macrocrystal-monohydrate, (MACROBID) 100 MG capsule  (Status: Discontinued) Take 1 capsule (100 mg total) by mouth 2 (two) times daily. 10 capsule Ival Domino, FNP   fluconazole  (DIFLUCAN ) 150 MG tablet Take 1 tablet by mouth today and repeat in 5-7 days for vaginal yeast infection. 2 tablet Aarush Stukey, FNP   cephALEXin (KEFLEX) 500 MG capsule Take 1 capsule (500 mg total) by mouth 4 (four) times daily for 7 days. 28 capsule Ival Domino, FNP      PDMP not reviewed this encounter.    Ival Domino, FNP 08/05/24 1113     [1]  Social  History Tobacco Use   Smoking status: Every Day    Types: Cigarettes   Smokeless tobacco: Never  Vaping Use   Vaping status: Never Used  Substance Use Topics   Alcohol use: Never   Drug use: Never     Ival Domino, FNP 08/05/24 1143  "

## 2024-08-05 NOTE — Discharge Instructions (Addendum)
 UTI with dysuria and some hematuria noted yesterday.  Urinalysis is abnormal today but not clearly showing a UTI.  Urine culture sent.  No blood in the urine today.  Will adjust the plan of care, if needed once the urine culture results.  Cephalexin, 500 mg, 1 pill 4 times daily with food for 7 days for probable UTI.  Patient was originally provided nitrofurantoin but when I spoke with her at discharge she reported that it causes itching.  It was not listed on her allergies but I have added it to her allergies.  She reports she has taken cephalexin without an allergy even though she has a penicillin allergy.    If the urine culture comes back negative we will contact the patient and tell her to stop the antibiotic.  Fluconazole , 150 mg, 1 pill now and repeat in 1 week.  Use the fluconazole , if needed for vaginal yeast infection after or while using the antibiotic.  Patient is reporting vaginal pain and a vaginal lesion and concern for potential exposure to STIs.  Sexually Transmitted Infection Testing (STI): The patient has some symptoms that could indicate an STI.  She was offered an STI swab that could test for gonorrhea, chlamydia, trichomonas, vaginal yeast, bacterial vaginosis.  She was offered blood testing for HIV and syphilis.  And, she was offered direct contact testing for herpes with a specific viral swab.  I reviewed the risks of these tests (infection, pain or bruising from having the blood drawn or pain from swabs and areas that are already tender and sore).  I reviewed the benefits of having these test (confirmation of specific diagnoses that help guide treatment).  I reviewed the alternatives of having these test (monitoring for symptoms and testing at a later date, if needed.  The patient elected to have the following tests done:gonorrhea, chlamydia, trichomonas, vaginal yeast, bacterial vaginosis, HIV, RPR (syphilis) and Herpes viral culture.     Follow-up with primary care or return here  if symptoms do not improve, worsen or new symptoms occur.

## 2024-08-05 NOTE — ED Triage Notes (Signed)
 Pt c/o dysuria, low back pain, pelvic pressure, hematuria, and nausea for the last few days.

## 2024-08-06 ENCOUNTER — Ambulatory Visit (HOSPITAL_BASED_OUTPATIENT_CLINIC_OR_DEPARTMENT_OTHER): Payer: Self-pay | Admitting: Family Medicine

## 2024-08-06 LAB — URINE CULTURE

## 2024-08-06 LAB — SYPHILIS: RPR W/REFLEX TO RPR TITER AND TREPONEMAL ANTIBODIES, TRADITIONAL SCREENING AND DIAGNOSIS ALGORITHM: RPR Ser Ql: NONREACTIVE

## 2024-08-06 LAB — HIV ANTIBODY (ROUTINE TESTING W REFLEX): HIV Screen 4th Generation wRfx: NONREACTIVE

## 2024-08-06 NOTE — Progress Notes (Signed)
 Blood test for HIV and syphilis are negative.  Patient updated.

## 2024-08-07 ENCOUNTER — Other Ambulatory Visit (HOSPITAL_COMMUNITY)
Admission: RE | Admit: 2024-08-07 | Discharge: 2024-08-07 | Disposition: A | Discharge status: Home / Self Care | Source: Ambulatory Visit

## 2024-08-07 DIAGNOSIS — Z7721 Contact with and (suspected) exposure to potentially hazardous body fluids: Secondary | ICD-10-CM | POA: Insufficient documentation

## 2024-08-07 DIAGNOSIS — R102 Pelvic and perineal pain unspecified side: Secondary | ICD-10-CM | POA: Insufficient documentation

## 2024-08-07 DIAGNOSIS — N898 Other specified noninflammatory disorders of vagina: Secondary | ICD-10-CM | POA: Diagnosis not present

## 2024-08-08 ENCOUNTER — Ambulatory Visit (HOSPITAL_BASED_OUTPATIENT_CLINIC_OR_DEPARTMENT_OTHER): Payer: Self-pay | Admitting: Family Medicine

## 2024-08-08 LAB — CERVICOVAGINAL ANCILLARY ONLY
Bacterial Vaginitis (gardnerella): POSITIVE — AB
Candida Glabrata: POSITIVE — AB
Candida Vaginitis: NEGATIVE
Chlamydia: NEGATIVE
Comment: NEGATIVE
Comment: NEGATIVE
Comment: NEGATIVE
Comment: NEGATIVE
Comment: NEGATIVE
Comment: NORMAL
Neisseria Gonorrhea: NEGATIVE
Trichomonas: NEGATIVE

## 2024-08-08 LAB — HSV 1/2 PCR (SURFACE)
HSV-1 DNA: NOT DETECTED
HSV-2 DNA: NOT DETECTED

## 2024-08-08 MED ORDER — METRONIDAZOLE 0.75 % VA GEL: VAGINAL | 0 refills | Status: AC

## 2024-08-08 NOTE — Progress Notes (Signed)
 HSV or herpes swab was negative.  There was a area that the patient was concerned about.  I did swab it.  But I felt like it was just scarring from many years previous vaginal delivery.  Patient has been updated via voicemail message of these negative results.

## 2024-08-08 NOTE — Progress Notes (Signed)
 Urine culture shows multiple contaminants.  Patient will complete the antibiotic but if symptoms persist will follow-up for repeat culture.  Vaginal swab shows vaginal yeast infection and bacterial vaginosis.  Patient already has a prescription for oral fluconazole .  Patient provided metronidazole  gel, 1 applicatorful once or twice daily for 7 days for bacterial vaginosis.  Patient has already been alerted that her syphilis and HIV blood test were negative.  Follow-up as needed.  All of the above information was left on voicemail for the patient.

## 2024-11-08 ENCOUNTER — Inpatient Hospital Stay

## 2024-11-08 ENCOUNTER — Inpatient Hospital Stay: Admitting: Hematology and Oncology
